# Patient Record
Sex: Female | Born: 1935 | Race: White | Hispanic: No | Smoking: Former smoker
Health system: Southern US, Community
[De-identification: ages and names within clinical notes are randomized; demographics above are authoritative.]

## PROBLEM LIST (undated history)

## (undated) DIAGNOSIS — I10 Essential (primary) hypertension: Secondary | ICD-10-CM

## (undated) DIAGNOSIS — F0390 Unspecified dementia without behavioral disturbance: Secondary | ICD-10-CM

## (undated) HISTORY — PX: ANKLE SURGERY: SHX546

---

## 2003-06-15 ENCOUNTER — Inpatient Hospital Stay (HOSPITAL_COMMUNITY): Admission: EM | Admit: 2003-06-15 | Discharge: 2003-06-19 | Payer: Self-pay | Admitting: Emergency Medicine

## 2011-06-17 DIAGNOSIS — M25569 Pain in unspecified knee: Secondary | ICD-10-CM | POA: Diagnosis not present

## 2011-06-17 DIAGNOSIS — I10 Essential (primary) hypertension: Secondary | ICD-10-CM | POA: Diagnosis not present

## 2011-06-17 DIAGNOSIS — E785 Hyperlipidemia, unspecified: Secondary | ICD-10-CM | POA: Diagnosis not present

## 2011-07-15 DIAGNOSIS — H409 Unspecified glaucoma: Secondary | ICD-10-CM | POA: Diagnosis not present

## 2011-07-15 DIAGNOSIS — H4011X0 Primary open-angle glaucoma, stage unspecified: Secondary | ICD-10-CM | POA: Diagnosis not present

## 2011-12-24 DIAGNOSIS — Z23 Encounter for immunization: Secondary | ICD-10-CM | POA: Diagnosis not present

## 2011-12-24 DIAGNOSIS — E785 Hyperlipidemia, unspecified: Secondary | ICD-10-CM | POA: Diagnosis not present

## 2011-12-24 DIAGNOSIS — I10 Essential (primary) hypertension: Secondary | ICD-10-CM | POA: Diagnosis not present

## 2011-12-24 DIAGNOSIS — Z0000 Encounter for general adult medical examination without abnormal findings: Secondary | ICD-10-CM | POA: Diagnosis not present

## 2011-12-24 DIAGNOSIS — M25569 Pain in unspecified knee: Secondary | ICD-10-CM | POA: Diagnosis not present

## 2012-01-27 DIAGNOSIS — H409 Unspecified glaucoma: Secondary | ICD-10-CM | POA: Diagnosis not present

## 2012-01-27 DIAGNOSIS — H4011X0 Primary open-angle glaucoma, stage unspecified: Secondary | ICD-10-CM | POA: Diagnosis not present

## 2012-04-24 DIAGNOSIS — E785 Hyperlipidemia, unspecified: Secondary | ICD-10-CM | POA: Diagnosis not present

## 2012-06-08 DIAGNOSIS — M899 Disorder of bone, unspecified: Secondary | ICD-10-CM | POA: Diagnosis not present

## 2012-06-08 DIAGNOSIS — I10 Essential (primary) hypertension: Secondary | ICD-10-CM | POA: Diagnosis not present

## 2012-06-08 DIAGNOSIS — E785 Hyperlipidemia, unspecified: Secondary | ICD-10-CM | POA: Diagnosis not present

## 2012-07-29 DIAGNOSIS — H40019 Open angle with borderline findings, low risk, unspecified eye: Secondary | ICD-10-CM | POA: Diagnosis not present

## 2013-01-25 DIAGNOSIS — H409 Unspecified glaucoma: Secondary | ICD-10-CM | POA: Diagnosis not present

## 2013-01-25 DIAGNOSIS — H4011X0 Primary open-angle glaucoma, stage unspecified: Secondary | ICD-10-CM | POA: Diagnosis not present

## 2013-03-02 DIAGNOSIS — H409 Unspecified glaucoma: Secondary | ICD-10-CM | POA: Diagnosis not present

## 2013-03-02 DIAGNOSIS — H4011X0 Primary open-angle glaucoma, stage unspecified: Secondary | ICD-10-CM | POA: Diagnosis not present

## 2013-03-17 DIAGNOSIS — Z23 Encounter for immunization: Secondary | ICD-10-CM | POA: Diagnosis not present

## 2013-03-17 DIAGNOSIS — I6350 Cerebral infarction due to unspecified occlusion or stenosis of unspecified cerebral artery: Secondary | ICD-10-CM | POA: Diagnosis not present

## 2013-03-17 DIAGNOSIS — I10 Essential (primary) hypertension: Secondary | ICD-10-CM | POA: Diagnosis not present

## 2013-03-17 DIAGNOSIS — M899 Disorder of bone, unspecified: Secondary | ICD-10-CM | POA: Diagnosis not present

## 2013-03-18 DIAGNOSIS — H409 Unspecified glaucoma: Secondary | ICD-10-CM | POA: Diagnosis not present

## 2013-03-18 DIAGNOSIS — H01029 Squamous blepharitis unspecified eye, unspecified eyelid: Secondary | ICD-10-CM | POA: Diagnosis not present

## 2013-03-18 DIAGNOSIS — H4011X0 Primary open-angle glaucoma, stage unspecified: Secondary | ICD-10-CM | POA: Diagnosis not present

## 2013-03-18 DIAGNOSIS — H04129 Dry eye syndrome of unspecified lacrimal gland: Secondary | ICD-10-CM | POA: Diagnosis not present

## 2013-04-22 DIAGNOSIS — H4011X0 Primary open-angle glaucoma, stage unspecified: Secondary | ICD-10-CM | POA: Diagnosis not present

## 2013-04-22 DIAGNOSIS — H409 Unspecified glaucoma: Secondary | ICD-10-CM | POA: Diagnosis not present

## 2013-05-19 ENCOUNTER — Other Ambulatory Visit: Payer: Self-pay | Admitting: Family Medicine

## 2013-05-19 ENCOUNTER — Ambulatory Visit
Admission: RE | Admit: 2013-05-19 | Discharge: 2013-05-19 | Disposition: A | Discharge status: Home / Self Care | Payer: Medicare Other | Source: Ambulatory Visit | Attending: Family Medicine | Admitting: Family Medicine

## 2013-05-19 DIAGNOSIS — S60229A Contusion of unspecified hand, initial encounter: Secondary | ICD-10-CM

## 2013-05-19 DIAGNOSIS — S62309A Unspecified fracture of unspecified metacarpal bone, initial encounter for closed fracture: Secondary | ICD-10-CM | POA: Diagnosis not present

## 2013-05-19 DIAGNOSIS — S62329A Displaced fracture of shaft of unspecified metacarpal bone, initial encounter for closed fracture: Secondary | ICD-10-CM | POA: Diagnosis not present

## 2013-05-21 DIAGNOSIS — S62309A Unspecified fracture of unspecified metacarpal bone, initial encounter for closed fracture: Secondary | ICD-10-CM | POA: Diagnosis not present

## 2013-05-28 DIAGNOSIS — S62309A Unspecified fracture of unspecified metacarpal bone, initial encounter for closed fracture: Secondary | ICD-10-CM | POA: Diagnosis not present

## 2013-05-28 DIAGNOSIS — M79609 Pain in unspecified limb: Secondary | ICD-10-CM | POA: Diagnosis not present

## 2013-06-04 DIAGNOSIS — S62309A Unspecified fracture of unspecified metacarpal bone, initial encounter for closed fracture: Secondary | ICD-10-CM | POA: Diagnosis not present

## 2013-06-14 DIAGNOSIS — S62309A Unspecified fracture of unspecified metacarpal bone, initial encounter for closed fracture: Secondary | ICD-10-CM | POA: Diagnosis not present

## 2013-07-06 DIAGNOSIS — S62309A Unspecified fracture of unspecified metacarpal bone, initial encounter for closed fracture: Secondary | ICD-10-CM | POA: Diagnosis not present

## 2013-07-21 DIAGNOSIS — S62309A Unspecified fracture of unspecified metacarpal bone, initial encounter for closed fracture: Secondary | ICD-10-CM | POA: Diagnosis not present

## 2013-07-21 DIAGNOSIS — M79609 Pain in unspecified limb: Secondary | ICD-10-CM | POA: Diagnosis not present

## 2013-07-27 DIAGNOSIS — H04129 Dry eye syndrome of unspecified lacrimal gland: Secondary | ICD-10-CM | POA: Diagnosis not present

## 2013-07-27 DIAGNOSIS — H4011X0 Primary open-angle glaucoma, stage unspecified: Secondary | ICD-10-CM | POA: Diagnosis not present

## 2013-07-27 DIAGNOSIS — H409 Unspecified glaucoma: Secondary | ICD-10-CM | POA: Diagnosis not present

## 2013-07-27 DIAGNOSIS — H01029 Squamous blepharitis unspecified eye, unspecified eyelid: Secondary | ICD-10-CM | POA: Diagnosis not present

## 2013-09-15 DIAGNOSIS — E785 Hyperlipidemia, unspecified: Secondary | ICD-10-CM | POA: Diagnosis not present

## 2013-09-15 DIAGNOSIS — M949 Disorder of cartilage, unspecified: Secondary | ICD-10-CM | POA: Diagnosis not present

## 2013-09-15 DIAGNOSIS — M25569 Pain in unspecified knee: Secondary | ICD-10-CM | POA: Diagnosis not present

## 2013-09-15 DIAGNOSIS — Z0000 Encounter for general adult medical examination without abnormal findings: Secondary | ICD-10-CM | POA: Diagnosis not present

## 2013-09-15 DIAGNOSIS — I10 Essential (primary) hypertension: Secondary | ICD-10-CM | POA: Diagnosis not present

## 2013-09-15 DIAGNOSIS — M899 Disorder of bone, unspecified: Secondary | ICD-10-CM | POA: Diagnosis not present

## 2013-09-15 DIAGNOSIS — R636 Underweight: Secondary | ICD-10-CM | POA: Diagnosis not present

## 2013-10-04 DIAGNOSIS — M899 Disorder of bone, unspecified: Secondary | ICD-10-CM | POA: Diagnosis not present

## 2013-10-04 DIAGNOSIS — M949 Disorder of cartilage, unspecified: Secondary | ICD-10-CM | POA: Diagnosis not present

## 2013-11-02 DIAGNOSIS — H409 Unspecified glaucoma: Secondary | ICD-10-CM | POA: Diagnosis not present

## 2013-11-02 DIAGNOSIS — H4011X0 Primary open-angle glaucoma, stage unspecified: Secondary | ICD-10-CM | POA: Diagnosis not present

## 2014-03-17 DIAGNOSIS — J069 Acute upper respiratory infection, unspecified: Secondary | ICD-10-CM | POA: Diagnosis not present

## 2014-03-17 DIAGNOSIS — M67441 Ganglion, right hand: Secondary | ICD-10-CM | POA: Diagnosis not present

## 2014-03-17 DIAGNOSIS — I10 Essential (primary) hypertension: Secondary | ICD-10-CM | POA: Diagnosis not present

## 2014-03-17 DIAGNOSIS — E780 Pure hypercholesterolemia: Secondary | ICD-10-CM | POA: Diagnosis not present

## 2014-03-17 DIAGNOSIS — Z23 Encounter for immunization: Secondary | ICD-10-CM | POA: Diagnosis not present

## 2014-03-17 DIAGNOSIS — E46 Unspecified protein-calorie malnutrition: Secondary | ICD-10-CM | POA: Diagnosis not present

## 2014-03-17 DIAGNOSIS — R233 Spontaneous ecchymoses: Secondary | ICD-10-CM | POA: Diagnosis not present

## 2014-04-11 DIAGNOSIS — E785 Hyperlipidemia, unspecified: Secondary | ICD-10-CM | POA: Diagnosis not present

## 2014-05-03 DIAGNOSIS — H4011X0 Primary open-angle glaucoma, stage unspecified: Secondary | ICD-10-CM | POA: Diagnosis not present

## 2014-09-19 DIAGNOSIS — E46 Unspecified protein-calorie malnutrition: Secondary | ICD-10-CM | POA: Diagnosis not present

## 2014-09-19 DIAGNOSIS — Z0000 Encounter for general adult medical examination without abnormal findings: Secondary | ICD-10-CM | POA: Diagnosis not present

## 2014-09-19 DIAGNOSIS — Z1389 Encounter for screening for other disorder: Secondary | ICD-10-CM | POA: Diagnosis not present

## 2014-09-19 DIAGNOSIS — D692 Other nonthrombocytopenic purpura: Secondary | ICD-10-CM | POA: Diagnosis not present

## 2014-09-19 DIAGNOSIS — E780 Pure hypercholesterolemia: Secondary | ICD-10-CM | POA: Diagnosis not present

## 2014-09-19 DIAGNOSIS — M8580 Other specified disorders of bone density and structure, unspecified site: Secondary | ICD-10-CM | POA: Diagnosis not present

## 2014-09-19 DIAGNOSIS — I10 Essential (primary) hypertension: Secondary | ICD-10-CM | POA: Diagnosis not present

## 2014-11-01 DIAGNOSIS — H04121 Dry eye syndrome of right lacrimal gland: Secondary | ICD-10-CM | POA: Diagnosis not present

## 2014-11-01 DIAGNOSIS — H4011X1 Primary open-angle glaucoma, mild stage: Secondary | ICD-10-CM | POA: Diagnosis not present

## 2015-04-06 DIAGNOSIS — Z23 Encounter for immunization: Secondary | ICD-10-CM | POA: Diagnosis not present

## 2015-04-06 DIAGNOSIS — I10 Essential (primary) hypertension: Secondary | ICD-10-CM | POA: Diagnosis not present

## 2015-04-06 DIAGNOSIS — J069 Acute upper respiratory infection, unspecified: Secondary | ICD-10-CM | POA: Diagnosis not present

## 2015-04-06 DIAGNOSIS — E7800 Pure hypercholesterolemia, unspecified: Secondary | ICD-10-CM | POA: Diagnosis not present

## 2015-05-04 DIAGNOSIS — H04121 Dry eye syndrome of right lacrimal gland: Secondary | ICD-10-CM | POA: Diagnosis not present

## 2015-05-04 DIAGNOSIS — H401131 Primary open-angle glaucoma, bilateral, mild stage: Secondary | ICD-10-CM | POA: Diagnosis not present

## 2015-05-04 DIAGNOSIS — H53013 Deprivation amblyopia, bilateral: Secondary | ICD-10-CM | POA: Diagnosis not present

## 2015-05-04 DIAGNOSIS — H4720 Unspecified optic atrophy: Secondary | ICD-10-CM | POA: Diagnosis not present

## 2015-11-08 DIAGNOSIS — H01001 Unspecified blepharitis right upper eyelid: Secondary | ICD-10-CM | POA: Diagnosis not present

## 2015-11-08 DIAGNOSIS — H01004 Unspecified blepharitis left upper eyelid: Secondary | ICD-10-CM | POA: Diagnosis not present

## 2015-11-08 DIAGNOSIS — H4720 Unspecified optic atrophy: Secondary | ICD-10-CM | POA: Diagnosis not present

## 2015-11-08 DIAGNOSIS — H401131 Primary open-angle glaucoma, bilateral, mild stage: Secondary | ICD-10-CM | POA: Diagnosis not present

## 2015-11-08 DIAGNOSIS — H53031 Strabismic amblyopia, right eye: Secondary | ICD-10-CM | POA: Diagnosis not present

## 2015-11-08 DIAGNOSIS — H01002 Unspecified blepharitis right lower eyelid: Secondary | ICD-10-CM | POA: Diagnosis not present

## 2015-11-08 DIAGNOSIS — H01005 Unspecified blepharitis left lower eyelid: Secondary | ICD-10-CM | POA: Diagnosis not present

## 2015-11-08 DIAGNOSIS — H04123 Dry eye syndrome of bilateral lacrimal glands: Secondary | ICD-10-CM | POA: Diagnosis not present

## 2015-11-15 DIAGNOSIS — M545 Low back pain: Secondary | ICD-10-CM | POA: Diagnosis not present

## 2015-11-15 DIAGNOSIS — D692 Other nonthrombocytopenic purpura: Secondary | ICD-10-CM | POA: Diagnosis not present

## 2015-11-15 DIAGNOSIS — Z0000 Encounter for general adult medical examination without abnormal findings: Secondary | ICD-10-CM | POA: Diagnosis not present

## 2015-11-15 DIAGNOSIS — E46 Unspecified protein-calorie malnutrition: Secondary | ICD-10-CM | POA: Diagnosis not present

## 2015-11-15 DIAGNOSIS — I10 Essential (primary) hypertension: Secondary | ICD-10-CM | POA: Diagnosis not present

## 2015-11-15 DIAGNOSIS — E7800 Pure hypercholesterolemia, unspecified: Secondary | ICD-10-CM | POA: Diagnosis not present

## 2015-11-15 DIAGNOSIS — R202 Paresthesia of skin: Secondary | ICD-10-CM | POA: Diagnosis not present

## 2015-11-15 DIAGNOSIS — Z1389 Encounter for screening for other disorder: Secondary | ICD-10-CM | POA: Diagnosis not present

## 2015-11-15 DIAGNOSIS — M8580 Other specified disorders of bone density and structure, unspecified site: Secondary | ICD-10-CM | POA: Diagnosis not present

## 2015-12-12 DIAGNOSIS — H01004 Unspecified blepharitis left upper eyelid: Secondary | ICD-10-CM | POA: Diagnosis not present

## 2015-12-12 DIAGNOSIS — H01002 Unspecified blepharitis right lower eyelid: Secondary | ICD-10-CM | POA: Diagnosis not present

## 2015-12-12 DIAGNOSIS — H401131 Primary open-angle glaucoma, bilateral, mild stage: Secondary | ICD-10-CM | POA: Diagnosis not present

## 2015-12-12 DIAGNOSIS — H4720 Unspecified optic atrophy: Secondary | ICD-10-CM | POA: Diagnosis not present

## 2015-12-12 DIAGNOSIS — H53031 Strabismic amblyopia, right eye: Secondary | ICD-10-CM | POA: Diagnosis not present

## 2015-12-12 DIAGNOSIS — H01001 Unspecified blepharitis right upper eyelid: Secondary | ICD-10-CM | POA: Diagnosis not present

## 2015-12-12 DIAGNOSIS — H01005 Unspecified blepharitis left lower eyelid: Secondary | ICD-10-CM | POA: Diagnosis not present

## 2015-12-12 DIAGNOSIS — H04123 Dry eye syndrome of bilateral lacrimal glands: Secondary | ICD-10-CM | POA: Diagnosis not present

## 2016-03-05 DIAGNOSIS — M8588 Other specified disorders of bone density and structure, other site: Secondary | ICD-10-CM | POA: Diagnosis not present

## 2016-03-05 DIAGNOSIS — M810 Age-related osteoporosis without current pathological fracture: Secondary | ICD-10-CM | POA: Diagnosis not present

## 2016-04-09 DIAGNOSIS — H01001 Unspecified blepharitis right upper eyelid: Secondary | ICD-10-CM | POA: Diagnosis not present

## 2016-04-09 DIAGNOSIS — H53031 Strabismic amblyopia, right eye: Secondary | ICD-10-CM | POA: Diagnosis not present

## 2016-04-09 DIAGNOSIS — H01002 Unspecified blepharitis right lower eyelid: Secondary | ICD-10-CM | POA: Diagnosis not present

## 2016-04-09 DIAGNOSIS — H04123 Dry eye syndrome of bilateral lacrimal glands: Secondary | ICD-10-CM | POA: Diagnosis not present

## 2016-04-09 DIAGNOSIS — H4720 Unspecified optic atrophy: Secondary | ICD-10-CM | POA: Diagnosis not present

## 2016-04-09 DIAGNOSIS — H01004 Unspecified blepharitis left upper eyelid: Secondary | ICD-10-CM | POA: Diagnosis not present

## 2016-04-09 DIAGNOSIS — H01005 Unspecified blepharitis left lower eyelid: Secondary | ICD-10-CM | POA: Diagnosis not present

## 2016-04-09 DIAGNOSIS — H401131 Primary open-angle glaucoma, bilateral, mild stage: Secondary | ICD-10-CM | POA: Diagnosis not present

## 2016-05-14 DIAGNOSIS — H16223 Keratoconjunctivitis sicca, not specified as Sjogren's, bilateral: Secondary | ICD-10-CM | POA: Diagnosis not present

## 2016-05-14 DIAGNOSIS — H53031 Strabismic amblyopia, right eye: Secondary | ICD-10-CM | POA: Diagnosis not present

## 2016-05-14 DIAGNOSIS — H04123 Dry eye syndrome of bilateral lacrimal glands: Secondary | ICD-10-CM | POA: Diagnosis not present

## 2016-05-14 DIAGNOSIS — H401131 Primary open-angle glaucoma, bilateral, mild stage: Secondary | ICD-10-CM | POA: Diagnosis not present

## 2016-08-13 DIAGNOSIS — H01001 Unspecified blepharitis right upper eyelid: Secondary | ICD-10-CM | POA: Diagnosis not present

## 2016-08-13 DIAGNOSIS — H16223 Keratoconjunctivitis sicca, not specified as Sjogren's, bilateral: Secondary | ICD-10-CM | POA: Diagnosis not present

## 2016-08-13 DIAGNOSIS — H04123 Dry eye syndrome of bilateral lacrimal glands: Secondary | ICD-10-CM | POA: Diagnosis not present

## 2016-08-13 DIAGNOSIS — H401134 Primary open-angle glaucoma, bilateral, indeterminate stage: Secondary | ICD-10-CM | POA: Diagnosis not present

## 2016-08-13 DIAGNOSIS — H01002 Unspecified blepharitis right lower eyelid: Secondary | ICD-10-CM | POA: Diagnosis not present

## 2017-02-04 DIAGNOSIS — Z961 Presence of intraocular lens: Secondary | ICD-10-CM | POA: Diagnosis not present

## 2017-02-04 DIAGNOSIS — H401131 Primary open-angle glaucoma, bilateral, mild stage: Secondary | ICD-10-CM | POA: Diagnosis not present

## 2017-03-17 DIAGNOSIS — H401131 Primary open-angle glaucoma, bilateral, mild stage: Secondary | ICD-10-CM | POA: Diagnosis not present

## 2017-04-15 DIAGNOSIS — H52223 Regular astigmatism, bilateral: Secondary | ICD-10-CM | POA: Diagnosis not present

## 2017-04-15 DIAGNOSIS — H524 Presbyopia: Secondary | ICD-10-CM | POA: Diagnosis not present

## 2017-04-15 DIAGNOSIS — H4720 Unspecified optic atrophy: Secondary | ICD-10-CM | POA: Diagnosis not present

## 2017-04-15 DIAGNOSIS — H04123 Dry eye syndrome of bilateral lacrimal glands: Secondary | ICD-10-CM | POA: Diagnosis not present

## 2017-04-15 DIAGNOSIS — H401134 Primary open-angle glaucoma, bilateral, indeterminate stage: Secondary | ICD-10-CM | POA: Diagnosis not present

## 2017-04-15 DIAGNOSIS — H5213 Myopia, bilateral: Secondary | ICD-10-CM | POA: Diagnosis not present

## 2017-04-15 DIAGNOSIS — H16223 Keratoconjunctivitis sicca, not specified as Sjogren's, bilateral: Secondary | ICD-10-CM | POA: Diagnosis not present

## 2017-05-20 DIAGNOSIS — H4720 Unspecified optic atrophy: Secondary | ICD-10-CM | POA: Diagnosis not present

## 2017-05-20 DIAGNOSIS — H401134 Primary open-angle glaucoma, bilateral, indeterminate stage: Secondary | ICD-10-CM | POA: Diagnosis not present

## 2017-05-20 DIAGNOSIS — H04123 Dry eye syndrome of bilateral lacrimal glands: Secondary | ICD-10-CM | POA: Diagnosis not present

## 2017-05-20 DIAGNOSIS — H53001 Unspecified amblyopia, right eye: Secondary | ICD-10-CM | POA: Diagnosis not present

## 2017-05-20 DIAGNOSIS — D3132 Benign neoplasm of left choroid: Secondary | ICD-10-CM | POA: Diagnosis not present

## 2017-05-20 DIAGNOSIS — H16223 Keratoconjunctivitis sicca, not specified as Sjogren's, bilateral: Secondary | ICD-10-CM | POA: Diagnosis not present

## 2017-06-17 DIAGNOSIS — H401131 Primary open-angle glaucoma, bilateral, mild stage: Secondary | ICD-10-CM | POA: Diagnosis not present

## 2017-10-07 DIAGNOSIS — F17210 Nicotine dependence, cigarettes, uncomplicated: Secondary | ICD-10-CM | POA: Diagnosis not present

## 2017-10-07 DIAGNOSIS — Z1389 Encounter for screening for other disorder: Secondary | ICD-10-CM | POA: Diagnosis not present

## 2017-10-07 DIAGNOSIS — M810 Age-related osteoporosis without current pathological fracture: Secondary | ICD-10-CM | POA: Diagnosis not present

## 2017-10-07 DIAGNOSIS — I10 Essential (primary) hypertension: Secondary | ICD-10-CM | POA: Diagnosis not present

## 2017-10-07 DIAGNOSIS — E7800 Pure hypercholesterolemia, unspecified: Secondary | ICD-10-CM | POA: Diagnosis not present

## 2017-10-07 DIAGNOSIS — E43 Unspecified severe protein-calorie malnutrition: Secondary | ICD-10-CM | POA: Diagnosis not present

## 2018-02-17 DIAGNOSIS — H53001 Unspecified amblyopia, right eye: Secondary | ICD-10-CM | POA: Diagnosis not present

## 2018-02-17 DIAGNOSIS — H16223 Keratoconjunctivitis sicca, not specified as Sjogren's, bilateral: Secondary | ICD-10-CM | POA: Diagnosis not present

## 2018-02-17 DIAGNOSIS — H04123 Dry eye syndrome of bilateral lacrimal glands: Secondary | ICD-10-CM | POA: Diagnosis not present

## 2018-02-17 DIAGNOSIS — H401134 Primary open-angle glaucoma, bilateral, indeterminate stage: Secondary | ICD-10-CM | POA: Diagnosis not present

## 2018-02-20 DIAGNOSIS — H04123 Dry eye syndrome of bilateral lacrimal glands: Secondary | ICD-10-CM | POA: Diagnosis not present

## 2018-02-20 DIAGNOSIS — H401134 Primary open-angle glaucoma, bilateral, indeterminate stage: Secondary | ICD-10-CM | POA: Diagnosis not present

## 2018-02-20 DIAGNOSIS — H16223 Keratoconjunctivitis sicca, not specified as Sjogren's, bilateral: Secondary | ICD-10-CM | POA: Diagnosis not present

## 2018-02-20 DIAGNOSIS — H4720 Unspecified optic atrophy: Secondary | ICD-10-CM | POA: Diagnosis not present

## 2018-03-17 DIAGNOSIS — H16223 Keratoconjunctivitis sicca, not specified as Sjogren's, bilateral: Secondary | ICD-10-CM | POA: Diagnosis not present

## 2018-03-17 DIAGNOSIS — H401134 Primary open-angle glaucoma, bilateral, indeterminate stage: Secondary | ICD-10-CM | POA: Diagnosis not present

## 2018-03-17 DIAGNOSIS — H04123 Dry eye syndrome of bilateral lacrimal glands: Secondary | ICD-10-CM | POA: Diagnosis not present

## 2018-05-15 DIAGNOSIS — Z0000 Encounter for general adult medical examination without abnormal findings: Secondary | ICD-10-CM | POA: Diagnosis not present

## 2018-05-15 DIAGNOSIS — E43 Unspecified severe protein-calorie malnutrition: Secondary | ICD-10-CM | POA: Diagnosis not present

## 2018-05-15 DIAGNOSIS — D692 Other nonthrombocytopenic purpura: Secondary | ICD-10-CM | POA: Diagnosis not present

## 2018-05-15 DIAGNOSIS — I10 Essential (primary) hypertension: Secondary | ICD-10-CM | POA: Diagnosis not present

## 2018-05-15 DIAGNOSIS — E2839 Other primary ovarian failure: Secondary | ICD-10-CM | POA: Diagnosis not present

## 2018-05-15 DIAGNOSIS — M810 Age-related osteoporosis without current pathological fracture: Secondary | ICD-10-CM | POA: Diagnosis not present

## 2018-05-15 DIAGNOSIS — E7800 Pure hypercholesterolemia, unspecified: Secondary | ICD-10-CM | POA: Diagnosis not present

## 2018-05-15 DIAGNOSIS — R202 Paresthesia of skin: Secondary | ICD-10-CM | POA: Diagnosis not present

## 2018-05-15 DIAGNOSIS — M545 Low back pain: Secondary | ICD-10-CM | POA: Diagnosis not present

## 2018-05-15 DIAGNOSIS — F17210 Nicotine dependence, cigarettes, uncomplicated: Secondary | ICD-10-CM | POA: Diagnosis not present

## 2018-05-15 DIAGNOSIS — E46 Unspecified protein-calorie malnutrition: Secondary | ICD-10-CM | POA: Diagnosis not present

## 2018-09-08 DIAGNOSIS — H401134 Primary open-angle glaucoma, bilateral, indeterminate stage: Secondary | ICD-10-CM | POA: Diagnosis not present

## 2018-09-08 DIAGNOSIS — H04123 Dry eye syndrome of bilateral lacrimal glands: Secondary | ICD-10-CM | POA: Diagnosis not present

## 2018-09-08 DIAGNOSIS — H53001 Unspecified amblyopia, right eye: Secondary | ICD-10-CM | POA: Diagnosis not present

## 2018-09-08 DIAGNOSIS — H16223 Keratoconjunctivitis sicca, not specified as Sjogren's, bilateral: Secondary | ICD-10-CM | POA: Diagnosis not present

## 2018-11-10 DIAGNOSIS — H20052 Hypopyon, left eye: Secondary | ICD-10-CM | POA: Diagnosis not present

## 2018-11-10 DIAGNOSIS — H5712 Ocular pain, left eye: Secondary | ICD-10-CM | POA: Diagnosis not present

## 2018-11-11 DIAGNOSIS — H16002 Unspecified corneal ulcer, left eye: Secondary | ICD-10-CM | POA: Diagnosis not present

## 2018-11-11 DIAGNOSIS — L03213 Periorbital cellulitis: Secondary | ICD-10-CM | POA: Diagnosis not present

## 2018-11-11 DIAGNOSIS — H18892 Other specified disorders of cornea, left eye: Secondary | ICD-10-CM | POA: Diagnosis not present

## 2018-11-12 DIAGNOSIS — F17200 Nicotine dependence, unspecified, uncomplicated: Secondary | ICD-10-CM | POA: Diagnosis present

## 2018-11-12 DIAGNOSIS — D3132 Benign neoplasm of left choroid: Secondary | ICD-10-CM | POA: Diagnosis present

## 2018-11-12 DIAGNOSIS — L03213 Periorbital cellulitis: Secondary | ICD-10-CM | POA: Diagnosis present

## 2018-11-12 DIAGNOSIS — R410 Disorientation, unspecified: Secondary | ICD-10-CM | POA: Diagnosis not present

## 2018-11-12 DIAGNOSIS — Z79899 Other long term (current) drug therapy: Secondary | ICD-10-CM | POA: Diagnosis not present

## 2018-11-12 DIAGNOSIS — Z8673 Personal history of transient ischemic attack (TIA), and cerebral infarction without residual deficits: Secondary | ICD-10-CM | POA: Diagnosis not present

## 2018-11-12 DIAGNOSIS — H5789 Other specified disorders of eye and adnexa: Secondary | ICD-10-CM | POA: Diagnosis present

## 2018-11-12 DIAGNOSIS — R6889 Other general symptoms and signs: Secondary | ICD-10-CM | POA: Diagnosis present

## 2018-11-12 DIAGNOSIS — H5010 Unspecified exotropia: Secondary | ICD-10-CM | POA: Diagnosis present

## 2018-11-12 DIAGNOSIS — H53001 Unspecified amblyopia, right eye: Secondary | ICD-10-CM | POA: Diagnosis present

## 2018-11-12 DIAGNOSIS — H401130 Primary open-angle glaucoma, bilateral, stage unspecified: Secondary | ICD-10-CM | POA: Diagnosis present

## 2018-11-12 DIAGNOSIS — I2510 Atherosclerotic heart disease of native coronary artery without angina pectoris: Secondary | ICD-10-CM | POA: Diagnosis not present

## 2018-11-12 DIAGNOSIS — I10 Essential (primary) hypertension: Secondary | ICD-10-CM | POA: Diagnosis not present

## 2018-11-12 DIAGNOSIS — H16002 Unspecified corneal ulcer, left eye: Secondary | ICD-10-CM | POA: Diagnosis present

## 2018-11-12 DIAGNOSIS — Z7982 Long term (current) use of aspirin: Secondary | ICD-10-CM | POA: Diagnosis not present

## 2018-11-12 DIAGNOSIS — M1990 Unspecified osteoarthritis, unspecified site: Secondary | ICD-10-CM | POA: Diagnosis present

## 2018-11-12 DIAGNOSIS — Z961 Presence of intraocular lens: Secondary | ICD-10-CM | POA: Diagnosis present

## 2018-11-16 DIAGNOSIS — L03213 Periorbital cellulitis: Secondary | ICD-10-CM | POA: Diagnosis not present

## 2018-11-16 DIAGNOSIS — H04129 Dry eye syndrome of unspecified lacrimal gland: Secondary | ICD-10-CM | POA: Diagnosis not present

## 2018-11-16 DIAGNOSIS — Z79899 Other long term (current) drug therapy: Secondary | ICD-10-CM | POA: Diagnosis not present

## 2018-11-16 DIAGNOSIS — H16002 Unspecified corneal ulcer, left eye: Secondary | ICD-10-CM | POA: Diagnosis not present

## 2018-11-16 DIAGNOSIS — H5010 Unspecified exotropia: Secondary | ICD-10-CM | POA: Diagnosis not present

## 2018-11-16 DIAGNOSIS — H53001 Unspecified amblyopia, right eye: Secondary | ICD-10-CM | POA: Diagnosis not present

## 2018-11-16 DIAGNOSIS — H401130 Primary open-angle glaucoma, bilateral, stage unspecified: Secondary | ICD-10-CM | POA: Diagnosis not present

## 2018-11-16 DIAGNOSIS — D3132 Benign neoplasm of left choroid: Secondary | ICD-10-CM | POA: Diagnosis not present

## 2018-11-20 DIAGNOSIS — Z79899 Other long term (current) drug therapy: Secondary | ICD-10-CM | POA: Diagnosis not present

## 2018-11-20 DIAGNOSIS — D3132 Benign neoplasm of left choroid: Secondary | ICD-10-CM | POA: Diagnosis not present

## 2018-11-20 DIAGNOSIS — H53001 Unspecified amblyopia, right eye: Secondary | ICD-10-CM | POA: Diagnosis not present

## 2018-11-20 DIAGNOSIS — H04129 Dry eye syndrome of unspecified lacrimal gland: Secondary | ICD-10-CM | POA: Diagnosis not present

## 2018-11-20 DIAGNOSIS — H401130 Primary open-angle glaucoma, bilateral, stage unspecified: Secondary | ICD-10-CM | POA: Diagnosis not present

## 2018-11-20 DIAGNOSIS — H16002 Unspecified corneal ulcer, left eye: Secondary | ICD-10-CM | POA: Diagnosis not present

## 2018-11-20 DIAGNOSIS — H5010 Unspecified exotropia: Secondary | ICD-10-CM | POA: Diagnosis not present

## 2018-11-23 DIAGNOSIS — H53001 Unspecified amblyopia, right eye: Secondary | ICD-10-CM | POA: Diagnosis not present

## 2018-11-23 DIAGNOSIS — H04129 Dry eye syndrome of unspecified lacrimal gland: Secondary | ICD-10-CM | POA: Diagnosis not present

## 2018-11-23 DIAGNOSIS — H16002 Unspecified corneal ulcer, left eye: Secondary | ICD-10-CM | POA: Diagnosis not present

## 2018-11-23 DIAGNOSIS — Z961 Presence of intraocular lens: Secondary | ICD-10-CM | POA: Diagnosis not present

## 2018-11-23 DIAGNOSIS — H50331 Intermittent monocular exotropia, right eye: Secondary | ICD-10-CM | POA: Diagnosis not present

## 2018-11-23 DIAGNOSIS — H401130 Primary open-angle glaucoma, bilateral, stage unspecified: Secondary | ICD-10-CM | POA: Diagnosis not present

## 2018-11-23 DIAGNOSIS — Z79899 Other long term (current) drug therapy: Secondary | ICD-10-CM | POA: Diagnosis not present

## 2018-11-23 DIAGNOSIS — D3132 Benign neoplasm of left choroid: Secondary | ICD-10-CM | POA: Diagnosis not present

## 2018-11-25 DIAGNOSIS — H04129 Dry eye syndrome of unspecified lacrimal gland: Secondary | ICD-10-CM | POA: Diagnosis not present

## 2018-11-25 DIAGNOSIS — Z961 Presence of intraocular lens: Secondary | ICD-10-CM | POA: Diagnosis not present

## 2018-11-25 DIAGNOSIS — H16002 Unspecified corneal ulcer, left eye: Secondary | ICD-10-CM | POA: Diagnosis not present

## 2018-11-25 DIAGNOSIS — Z79899 Other long term (current) drug therapy: Secondary | ICD-10-CM | POA: Diagnosis not present

## 2018-11-25 DIAGNOSIS — H5462 Unqualified visual loss, left eye, normal vision right eye: Secondary | ICD-10-CM | POA: Diagnosis not present

## 2018-11-25 DIAGNOSIS — F331 Major depressive disorder, recurrent, moderate: Secondary | ICD-10-CM | POA: Diagnosis not present

## 2018-11-25 DIAGNOSIS — H53001 Unspecified amblyopia, right eye: Secondary | ICD-10-CM | POA: Diagnosis not present

## 2018-11-25 DIAGNOSIS — R3915 Urgency of urination: Secondary | ICD-10-CM | POA: Diagnosis not present

## 2018-11-25 DIAGNOSIS — D3132 Benign neoplasm of left choroid: Secondary | ICD-10-CM | POA: Diagnosis not present

## 2018-11-25 DIAGNOSIS — H401134 Primary open-angle glaucoma, bilateral, indeterminate stage: Secondary | ICD-10-CM | POA: Diagnosis not present

## 2018-11-25 DIAGNOSIS — H50331 Intermittent monocular exotropia, right eye: Secondary | ICD-10-CM | POA: Diagnosis not present

## 2018-11-27 DIAGNOSIS — H04129 Dry eye syndrome of unspecified lacrimal gland: Secondary | ICD-10-CM | POA: Diagnosis not present

## 2018-11-27 DIAGNOSIS — H16002 Unspecified corneal ulcer, left eye: Secondary | ICD-10-CM | POA: Diagnosis not present

## 2018-11-27 DIAGNOSIS — H401130 Primary open-angle glaucoma, bilateral, stage unspecified: Secondary | ICD-10-CM | POA: Diagnosis not present

## 2018-11-27 DIAGNOSIS — D3132 Benign neoplasm of left choroid: Secondary | ICD-10-CM | POA: Diagnosis not present

## 2018-11-27 DIAGNOSIS — Z79899 Other long term (current) drug therapy: Secondary | ICD-10-CM | POA: Diagnosis not present

## 2018-11-27 DIAGNOSIS — Z961 Presence of intraocular lens: Secondary | ICD-10-CM | POA: Diagnosis not present

## 2018-11-27 DIAGNOSIS — H53001 Unspecified amblyopia, right eye: Secondary | ICD-10-CM | POA: Diagnosis not present

## 2018-11-27 DIAGNOSIS — H5010 Unspecified exotropia: Secondary | ICD-10-CM | POA: Diagnosis not present

## 2018-11-30 DIAGNOSIS — H401130 Primary open-angle glaucoma, bilateral, stage unspecified: Secondary | ICD-10-CM | POA: Diagnosis not present

## 2018-11-30 DIAGNOSIS — Z01812 Encounter for preprocedural laboratory examination: Secondary | ICD-10-CM | POA: Diagnosis not present

## 2018-11-30 DIAGNOSIS — D3132 Benign neoplasm of left choroid: Secondary | ICD-10-CM | POA: Diagnosis not present

## 2018-11-30 DIAGNOSIS — Z961 Presence of intraocular lens: Secondary | ICD-10-CM | POA: Diagnosis not present

## 2018-11-30 DIAGNOSIS — H16002 Unspecified corneal ulcer, left eye: Secondary | ICD-10-CM | POA: Diagnosis not present

## 2018-11-30 DIAGNOSIS — H31402 Unspecified choroidal detachment, left eye: Secondary | ICD-10-CM | POA: Diagnosis not present

## 2018-11-30 DIAGNOSIS — Z01818 Encounter for other preprocedural examination: Secondary | ICD-10-CM | POA: Diagnosis not present

## 2018-11-30 DIAGNOSIS — H53002 Unspecified amblyopia, left eye: Secondary | ICD-10-CM | POA: Diagnosis not present

## 2018-11-30 DIAGNOSIS — Z20828 Contact with and (suspected) exposure to other viral communicable diseases: Secondary | ICD-10-CM | POA: Diagnosis not present

## 2018-11-30 DIAGNOSIS — Z79899 Other long term (current) drug therapy: Secondary | ICD-10-CM | POA: Diagnosis not present

## 2018-11-30 DIAGNOSIS — H04129 Dry eye syndrome of unspecified lacrimal gland: Secondary | ICD-10-CM | POA: Diagnosis not present

## 2018-11-30 DIAGNOSIS — H5010 Unspecified exotropia: Secondary | ICD-10-CM | POA: Diagnosis not present

## 2018-12-02 DIAGNOSIS — H16002 Unspecified corneal ulcer, left eye: Secondary | ICD-10-CM | POA: Diagnosis not present

## 2018-12-02 DIAGNOSIS — H15042 Scleritis with corneal involvement, left eye: Secondary | ICD-10-CM | POA: Diagnosis not present

## 2018-12-03 DIAGNOSIS — D3132 Benign neoplasm of left choroid: Secondary | ICD-10-CM | POA: Diagnosis not present

## 2018-12-03 DIAGNOSIS — H53001 Unspecified amblyopia, right eye: Secondary | ICD-10-CM | POA: Diagnosis not present

## 2018-12-03 DIAGNOSIS — Z961 Presence of intraocular lens: Secondary | ICD-10-CM | POA: Diagnosis not present

## 2018-12-03 DIAGNOSIS — H16002 Unspecified corneal ulcer, left eye: Secondary | ICD-10-CM | POA: Diagnosis not present

## 2018-12-04 DIAGNOSIS — D3132 Benign neoplasm of left choroid: Secondary | ICD-10-CM | POA: Diagnosis not present

## 2018-12-04 DIAGNOSIS — Z48810 Encounter for surgical aftercare following surgery on the sense organs: Secondary | ICD-10-CM | POA: Diagnosis not present

## 2018-12-04 DIAGNOSIS — H15092 Other scleritis, left eye: Secondary | ICD-10-CM | POA: Diagnosis not present

## 2018-12-04 DIAGNOSIS — H53001 Unspecified amblyopia, right eye: Secondary | ICD-10-CM | POA: Diagnosis not present

## 2018-12-04 DIAGNOSIS — H16002 Unspecified corneal ulcer, left eye: Secondary | ICD-10-CM | POA: Diagnosis not present

## 2018-12-04 DIAGNOSIS — Z79899 Other long term (current) drug therapy: Secondary | ICD-10-CM | POA: Diagnosis not present

## 2018-12-11 DIAGNOSIS — H15092 Other scleritis, left eye: Secondary | ICD-10-CM | POA: Diagnosis not present

## 2018-12-24 DIAGNOSIS — R7301 Impaired fasting glucose: Secondary | ICD-10-CM | POA: Diagnosis not present

## 2018-12-24 DIAGNOSIS — F331 Major depressive disorder, recurrent, moderate: Secondary | ICD-10-CM | POA: Diagnosis not present

## 2018-12-24 DIAGNOSIS — I10 Essential (primary) hypertension: Secondary | ICD-10-CM | POA: Diagnosis not present

## 2018-12-24 DIAGNOSIS — R32 Unspecified urinary incontinence: Secondary | ICD-10-CM | POA: Diagnosis not present

## 2018-12-24 DIAGNOSIS — R410 Disorientation, unspecified: Secondary | ICD-10-CM | POA: Diagnosis not present

## 2018-12-31 DIAGNOSIS — W19XXXA Unspecified fall, initial encounter: Secondary | ICD-10-CM | POA: Diagnosis not present

## 2018-12-31 DIAGNOSIS — I491 Atrial premature depolarization: Secondary | ICD-10-CM | POA: Diagnosis not present

## 2018-12-31 DIAGNOSIS — I959 Hypotension, unspecified: Secondary | ICD-10-CM | POA: Diagnosis not present

## 2019-01-12 DIAGNOSIS — H53001 Unspecified amblyopia, right eye: Secondary | ICD-10-CM | POA: Diagnosis not present

## 2019-01-12 DIAGNOSIS — H04123 Dry eye syndrome of bilateral lacrimal glands: Secondary | ICD-10-CM | POA: Diagnosis not present

## 2019-01-12 DIAGNOSIS — H16223 Keratoconjunctivitis sicca, not specified as Sjogren's, bilateral: Secondary | ICD-10-CM | POA: Diagnosis not present

## 2019-01-12 DIAGNOSIS — H401134 Primary open-angle glaucoma, bilateral, indeterminate stage: Secondary | ICD-10-CM | POA: Diagnosis not present

## 2019-01-12 DIAGNOSIS — H4720 Unspecified optic atrophy: Secondary | ICD-10-CM | POA: Diagnosis not present

## 2019-01-18 DIAGNOSIS — H16002 Unspecified corneal ulcer, left eye: Secondary | ICD-10-CM | POA: Diagnosis not present

## 2019-01-18 DIAGNOSIS — H15092 Other scleritis, left eye: Secondary | ICD-10-CM | POA: Diagnosis not present

## 2019-01-19 DIAGNOSIS — F324 Major depressive disorder, single episode, in partial remission: Secondary | ICD-10-CM | POA: Diagnosis not present

## 2019-01-19 DIAGNOSIS — W19XXXA Unspecified fall, initial encounter: Secondary | ICD-10-CM | POA: Diagnosis not present

## 2019-01-19 DIAGNOSIS — H5316 Psychophysical visual disturbances: Secondary | ICD-10-CM | POA: Diagnosis not present

## 2019-01-19 DIAGNOSIS — S81812A Laceration without foreign body, left lower leg, initial encounter: Secondary | ICD-10-CM | POA: Diagnosis not present

## 2019-01-19 DIAGNOSIS — Y92090 Kitchen in other non-institutional residence as the place of occurrence of the external cause: Secondary | ICD-10-CM | POA: Diagnosis not present

## 2019-01-20 DIAGNOSIS — R7303 Prediabetes: Secondary | ICD-10-CM | POA: Diagnosis not present

## 2019-01-20 DIAGNOSIS — M8580 Other specified disorders of bone density and structure, unspecified site: Secondary | ICD-10-CM | POA: Diagnosis not present

## 2019-01-20 DIAGNOSIS — I10 Essential (primary) hypertension: Secondary | ICD-10-CM | POA: Diagnosis not present

## 2019-01-20 DIAGNOSIS — W19XXXD Unspecified fall, subsequent encounter: Secondary | ICD-10-CM | POA: Diagnosis not present

## 2019-01-20 DIAGNOSIS — Z9841 Cataract extraction status, right eye: Secondary | ICD-10-CM | POA: Diagnosis not present

## 2019-01-20 DIAGNOSIS — Z9842 Cataract extraction status, left eye: Secondary | ICD-10-CM | POA: Diagnosis not present

## 2019-01-20 DIAGNOSIS — S81812D Laceration without foreign body, left lower leg, subsequent encounter: Secondary | ICD-10-CM | POA: Diagnosis not present

## 2019-01-20 DIAGNOSIS — H5316 Psychophysical visual disturbances: Secondary | ICD-10-CM | POA: Diagnosis not present

## 2019-01-20 DIAGNOSIS — M170 Bilateral primary osteoarthritis of knee: Secondary | ICD-10-CM | POA: Diagnosis not present

## 2019-01-20 DIAGNOSIS — Z8673 Personal history of transient ischemic attack (TIA), and cerebral infarction without residual deficits: Secondary | ICD-10-CM | POA: Diagnosis not present

## 2019-01-20 DIAGNOSIS — H543 Unqualified visual loss, both eyes: Secondary | ICD-10-CM | POA: Diagnosis not present

## 2019-01-20 DIAGNOSIS — F17210 Nicotine dependence, cigarettes, uncomplicated: Secondary | ICD-10-CM | POA: Diagnosis not present

## 2019-01-20 DIAGNOSIS — Z7982 Long term (current) use of aspirin: Secondary | ICD-10-CM | POA: Diagnosis not present

## 2019-01-20 DIAGNOSIS — Z79899 Other long term (current) drug therapy: Secondary | ICD-10-CM | POA: Diagnosis not present

## 2019-01-20 DIAGNOSIS — F324 Major depressive disorder, single episode, in partial remission: Secondary | ICD-10-CM | POA: Diagnosis not present

## 2019-01-21 DIAGNOSIS — I10 Essential (primary) hypertension: Secondary | ICD-10-CM | POA: Diagnosis not present

## 2019-01-21 DIAGNOSIS — S81812D Laceration without foreign body, left lower leg, subsequent encounter: Secondary | ICD-10-CM | POA: Diagnosis not present

## 2019-01-21 DIAGNOSIS — H5316 Psychophysical visual disturbances: Secondary | ICD-10-CM | POA: Diagnosis not present

## 2019-01-21 DIAGNOSIS — M170 Bilateral primary osteoarthritis of knee: Secondary | ICD-10-CM | POA: Diagnosis not present

## 2019-01-21 DIAGNOSIS — H543 Unqualified visual loss, both eyes: Secondary | ICD-10-CM | POA: Diagnosis not present

## 2019-01-21 DIAGNOSIS — M8580 Other specified disorders of bone density and structure, unspecified site: Secondary | ICD-10-CM | POA: Diagnosis not present

## 2019-01-25 DIAGNOSIS — I10 Essential (primary) hypertension: Secondary | ICD-10-CM | POA: Diagnosis not present

## 2019-01-25 DIAGNOSIS — S81812D Laceration without foreign body, left lower leg, subsequent encounter: Secondary | ICD-10-CM | POA: Diagnosis not present

## 2019-01-25 DIAGNOSIS — M170 Bilateral primary osteoarthritis of knee: Secondary | ICD-10-CM | POA: Diagnosis not present

## 2019-01-25 DIAGNOSIS — M8580 Other specified disorders of bone density and structure, unspecified site: Secondary | ICD-10-CM | POA: Diagnosis not present

## 2019-01-25 DIAGNOSIS — H543 Unqualified visual loss, both eyes: Secondary | ICD-10-CM | POA: Diagnosis not present

## 2019-01-25 DIAGNOSIS — H5316 Psychophysical visual disturbances: Secondary | ICD-10-CM | POA: Diagnosis not present

## 2019-01-26 DIAGNOSIS — I10 Essential (primary) hypertension: Secondary | ICD-10-CM | POA: Diagnosis not present

## 2019-01-26 DIAGNOSIS — H5316 Psychophysical visual disturbances: Secondary | ICD-10-CM | POA: Diagnosis not present

## 2019-01-26 DIAGNOSIS — M170 Bilateral primary osteoarthritis of knee: Secondary | ICD-10-CM | POA: Diagnosis not present

## 2019-01-26 DIAGNOSIS — H543 Unqualified visual loss, both eyes: Secondary | ICD-10-CM | POA: Diagnosis not present

## 2019-01-26 DIAGNOSIS — M8580 Other specified disorders of bone density and structure, unspecified site: Secondary | ICD-10-CM | POA: Diagnosis not present

## 2019-01-26 DIAGNOSIS — S81812D Laceration without foreign body, left lower leg, subsequent encounter: Secondary | ICD-10-CM | POA: Diagnosis not present

## 2019-01-27 DIAGNOSIS — H5316 Psychophysical visual disturbances: Secondary | ICD-10-CM | POA: Diagnosis not present

## 2019-01-27 DIAGNOSIS — S81812D Laceration without foreign body, left lower leg, subsequent encounter: Secondary | ICD-10-CM | POA: Diagnosis not present

## 2019-01-27 DIAGNOSIS — M8580 Other specified disorders of bone density and structure, unspecified site: Secondary | ICD-10-CM | POA: Diagnosis not present

## 2019-01-27 DIAGNOSIS — I10 Essential (primary) hypertension: Secondary | ICD-10-CM | POA: Diagnosis not present

## 2019-01-27 DIAGNOSIS — M170 Bilateral primary osteoarthritis of knee: Secondary | ICD-10-CM | POA: Diagnosis not present

## 2019-01-27 DIAGNOSIS — H543 Unqualified visual loss, both eyes: Secondary | ICD-10-CM | POA: Diagnosis not present

## 2019-01-29 DIAGNOSIS — M170 Bilateral primary osteoarthritis of knee: Secondary | ICD-10-CM | POA: Diagnosis not present

## 2019-01-29 DIAGNOSIS — H543 Unqualified visual loss, both eyes: Secondary | ICD-10-CM | POA: Diagnosis not present

## 2019-01-29 DIAGNOSIS — I10 Essential (primary) hypertension: Secondary | ICD-10-CM | POA: Diagnosis not present

## 2019-01-29 DIAGNOSIS — H5316 Psychophysical visual disturbances: Secondary | ICD-10-CM | POA: Diagnosis not present

## 2019-01-29 DIAGNOSIS — M8580 Other specified disorders of bone density and structure, unspecified site: Secondary | ICD-10-CM | POA: Diagnosis not present

## 2019-01-29 DIAGNOSIS — S81812D Laceration without foreign body, left lower leg, subsequent encounter: Secondary | ICD-10-CM | POA: Diagnosis not present

## 2019-02-01 DIAGNOSIS — H543 Unqualified visual loss, both eyes: Secondary | ICD-10-CM | POA: Diagnosis not present

## 2019-02-01 DIAGNOSIS — M170 Bilateral primary osteoarthritis of knee: Secondary | ICD-10-CM | POA: Diagnosis not present

## 2019-02-01 DIAGNOSIS — M8580 Other specified disorders of bone density and structure, unspecified site: Secondary | ICD-10-CM | POA: Diagnosis not present

## 2019-02-01 DIAGNOSIS — S81812D Laceration without foreign body, left lower leg, subsequent encounter: Secondary | ICD-10-CM | POA: Diagnosis not present

## 2019-02-01 DIAGNOSIS — H5316 Psychophysical visual disturbances: Secondary | ICD-10-CM | POA: Diagnosis not present

## 2019-02-01 DIAGNOSIS — I10 Essential (primary) hypertension: Secondary | ICD-10-CM | POA: Diagnosis not present

## 2019-02-02 DIAGNOSIS — M170 Bilateral primary osteoarthritis of knee: Secondary | ICD-10-CM | POA: Diagnosis not present

## 2019-02-02 DIAGNOSIS — I10 Essential (primary) hypertension: Secondary | ICD-10-CM | POA: Diagnosis not present

## 2019-02-02 DIAGNOSIS — H5316 Psychophysical visual disturbances: Secondary | ICD-10-CM | POA: Diagnosis not present

## 2019-02-02 DIAGNOSIS — M8580 Other specified disorders of bone density and structure, unspecified site: Secondary | ICD-10-CM | POA: Diagnosis not present

## 2019-02-02 DIAGNOSIS — H543 Unqualified visual loss, both eyes: Secondary | ICD-10-CM | POA: Diagnosis not present

## 2019-02-02 DIAGNOSIS — S81812D Laceration without foreign body, left lower leg, subsequent encounter: Secondary | ICD-10-CM | POA: Diagnosis not present

## 2019-02-03 DIAGNOSIS — M170 Bilateral primary osteoarthritis of knee: Secondary | ICD-10-CM | POA: Diagnosis not present

## 2019-02-03 DIAGNOSIS — H5316 Psychophysical visual disturbances: Secondary | ICD-10-CM | POA: Diagnosis not present

## 2019-02-03 DIAGNOSIS — R410 Disorientation, unspecified: Secondary | ICD-10-CM | POA: Diagnosis not present

## 2019-02-03 DIAGNOSIS — F331 Major depressive disorder, recurrent, moderate: Secondary | ICD-10-CM | POA: Diagnosis not present

## 2019-02-03 DIAGNOSIS — M8580 Other specified disorders of bone density and structure, unspecified site: Secondary | ICD-10-CM | POA: Diagnosis not present

## 2019-02-03 DIAGNOSIS — I10 Essential (primary) hypertension: Secondary | ICD-10-CM | POA: Diagnosis not present

## 2019-02-03 DIAGNOSIS — H543 Unqualified visual loss, both eyes: Secondary | ICD-10-CM | POA: Diagnosis not present

## 2019-02-03 DIAGNOSIS — S81812D Laceration without foreign body, left lower leg, subsequent encounter: Secondary | ICD-10-CM | POA: Diagnosis not present

## 2019-02-03 DIAGNOSIS — L97921 Non-pressure chronic ulcer of unspecified part of left lower leg limited to breakdown of skin: Secondary | ICD-10-CM | POA: Diagnosis not present

## 2019-02-05 DIAGNOSIS — H5316 Psychophysical visual disturbances: Secondary | ICD-10-CM | POA: Diagnosis not present

## 2019-02-05 DIAGNOSIS — M8580 Other specified disorders of bone density and structure, unspecified site: Secondary | ICD-10-CM | POA: Diagnosis not present

## 2019-02-05 DIAGNOSIS — M170 Bilateral primary osteoarthritis of knee: Secondary | ICD-10-CM | POA: Diagnosis not present

## 2019-02-05 DIAGNOSIS — I10 Essential (primary) hypertension: Secondary | ICD-10-CM | POA: Diagnosis not present

## 2019-02-05 DIAGNOSIS — S81812D Laceration without foreign body, left lower leg, subsequent encounter: Secondary | ICD-10-CM | POA: Diagnosis not present

## 2019-02-05 DIAGNOSIS — H543 Unqualified visual loss, both eyes: Secondary | ICD-10-CM | POA: Diagnosis not present

## 2019-02-08 DIAGNOSIS — H543 Unqualified visual loss, both eyes: Secondary | ICD-10-CM | POA: Diagnosis not present

## 2019-02-08 DIAGNOSIS — S81812D Laceration without foreign body, left lower leg, subsequent encounter: Secondary | ICD-10-CM | POA: Diagnosis not present

## 2019-02-08 DIAGNOSIS — I10 Essential (primary) hypertension: Secondary | ICD-10-CM | POA: Diagnosis not present

## 2019-02-08 DIAGNOSIS — M8580 Other specified disorders of bone density and structure, unspecified site: Secondary | ICD-10-CM | POA: Diagnosis not present

## 2019-02-08 DIAGNOSIS — M170 Bilateral primary osteoarthritis of knee: Secondary | ICD-10-CM | POA: Diagnosis not present

## 2019-02-08 DIAGNOSIS — H5316 Psychophysical visual disturbances: Secondary | ICD-10-CM | POA: Diagnosis not present

## 2019-02-09 DIAGNOSIS — M170 Bilateral primary osteoarthritis of knee: Secondary | ICD-10-CM | POA: Diagnosis not present

## 2019-02-09 DIAGNOSIS — H543 Unqualified visual loss, both eyes: Secondary | ICD-10-CM | POA: Diagnosis not present

## 2019-02-09 DIAGNOSIS — M8580 Other specified disorders of bone density and structure, unspecified site: Secondary | ICD-10-CM | POA: Diagnosis not present

## 2019-02-09 DIAGNOSIS — I10 Essential (primary) hypertension: Secondary | ICD-10-CM | POA: Diagnosis not present

## 2019-02-09 DIAGNOSIS — S81812D Laceration without foreign body, left lower leg, subsequent encounter: Secondary | ICD-10-CM | POA: Diagnosis not present

## 2019-02-09 DIAGNOSIS — H5316 Psychophysical visual disturbances: Secondary | ICD-10-CM | POA: Diagnosis not present

## 2019-02-10 DIAGNOSIS — I10 Essential (primary) hypertension: Secondary | ICD-10-CM | POA: Diagnosis not present

## 2019-02-10 DIAGNOSIS — S81812D Laceration without foreign body, left lower leg, subsequent encounter: Secondary | ICD-10-CM | POA: Diagnosis not present

## 2019-02-10 DIAGNOSIS — H543 Unqualified visual loss, both eyes: Secondary | ICD-10-CM | POA: Diagnosis not present

## 2019-02-10 DIAGNOSIS — M8580 Other specified disorders of bone density and structure, unspecified site: Secondary | ICD-10-CM | POA: Diagnosis not present

## 2019-02-10 DIAGNOSIS — M170 Bilateral primary osteoarthritis of knee: Secondary | ICD-10-CM | POA: Diagnosis not present

## 2019-02-10 DIAGNOSIS — H5316 Psychophysical visual disturbances: Secondary | ICD-10-CM | POA: Diagnosis not present

## 2019-02-16 DIAGNOSIS — H543 Unqualified visual loss, both eyes: Secondary | ICD-10-CM | POA: Diagnosis not present

## 2019-02-16 DIAGNOSIS — M8580 Other specified disorders of bone density and structure, unspecified site: Secondary | ICD-10-CM | POA: Diagnosis not present

## 2019-02-16 DIAGNOSIS — S81812D Laceration without foreign body, left lower leg, subsequent encounter: Secondary | ICD-10-CM | POA: Diagnosis not present

## 2019-02-16 DIAGNOSIS — I10 Essential (primary) hypertension: Secondary | ICD-10-CM | POA: Diagnosis not present

## 2019-02-16 DIAGNOSIS — H5316 Psychophysical visual disturbances: Secondary | ICD-10-CM | POA: Diagnosis not present

## 2019-02-16 DIAGNOSIS — M170 Bilateral primary osteoarthritis of knee: Secondary | ICD-10-CM | POA: Diagnosis not present

## 2019-02-18 ENCOUNTER — Other Ambulatory Visit: Payer: Self-pay | Admitting: Family Medicine

## 2019-02-18 DIAGNOSIS — L97929 Non-pressure chronic ulcer of unspecified part of left lower leg with unspecified severity: Secondary | ICD-10-CM

## 2019-02-18 DIAGNOSIS — H5316 Psychophysical visual disturbances: Secondary | ICD-10-CM | POA: Diagnosis not present

## 2019-02-18 DIAGNOSIS — M8580 Other specified disorders of bone density and structure, unspecified site: Secondary | ICD-10-CM | POA: Diagnosis not present

## 2019-02-18 DIAGNOSIS — I10 Essential (primary) hypertension: Secondary | ICD-10-CM | POA: Diagnosis not present

## 2019-02-18 DIAGNOSIS — M170 Bilateral primary osteoarthritis of knee: Secondary | ICD-10-CM | POA: Diagnosis not present

## 2019-02-18 DIAGNOSIS — S81812D Laceration without foreign body, left lower leg, subsequent encounter: Secondary | ICD-10-CM | POA: Diagnosis not present

## 2019-02-18 DIAGNOSIS — H543 Unqualified visual loss, both eyes: Secondary | ICD-10-CM | POA: Diagnosis not present

## 2019-02-19 DIAGNOSIS — Z8673 Personal history of transient ischemic attack (TIA), and cerebral infarction without residual deficits: Secondary | ICD-10-CM | POA: Diagnosis not present

## 2019-02-19 DIAGNOSIS — F17210 Nicotine dependence, cigarettes, uncomplicated: Secondary | ICD-10-CM | POA: Diagnosis not present

## 2019-02-19 DIAGNOSIS — M170 Bilateral primary osteoarthritis of knee: Secondary | ICD-10-CM | POA: Diagnosis not present

## 2019-02-19 DIAGNOSIS — H543 Unqualified visual loss, both eyes: Secondary | ICD-10-CM | POA: Diagnosis not present

## 2019-02-19 DIAGNOSIS — Z7982 Long term (current) use of aspirin: Secondary | ICD-10-CM | POA: Diagnosis not present

## 2019-02-19 DIAGNOSIS — R7303 Prediabetes: Secondary | ICD-10-CM | POA: Diagnosis not present

## 2019-02-19 DIAGNOSIS — F324 Major depressive disorder, single episode, in partial remission: Secondary | ICD-10-CM | POA: Diagnosis not present

## 2019-02-19 DIAGNOSIS — S81812D Laceration without foreign body, left lower leg, subsequent encounter: Secondary | ICD-10-CM | POA: Diagnosis not present

## 2019-02-19 DIAGNOSIS — M8580 Other specified disorders of bone density and structure, unspecified site: Secondary | ICD-10-CM | POA: Diagnosis not present

## 2019-02-19 DIAGNOSIS — Z9842 Cataract extraction status, left eye: Secondary | ICD-10-CM | POA: Diagnosis not present

## 2019-02-19 DIAGNOSIS — H5316 Psychophysical visual disturbances: Secondary | ICD-10-CM | POA: Diagnosis not present

## 2019-02-19 DIAGNOSIS — W19XXXD Unspecified fall, subsequent encounter: Secondary | ICD-10-CM | POA: Diagnosis not present

## 2019-02-19 DIAGNOSIS — I10 Essential (primary) hypertension: Secondary | ICD-10-CM | POA: Diagnosis not present

## 2019-02-19 DIAGNOSIS — Z79899 Other long term (current) drug therapy: Secondary | ICD-10-CM | POA: Diagnosis not present

## 2019-02-19 DIAGNOSIS — Z9841 Cataract extraction status, right eye: Secondary | ICD-10-CM | POA: Diagnosis not present

## 2019-02-22 DIAGNOSIS — H5316 Psychophysical visual disturbances: Secondary | ICD-10-CM | POA: Diagnosis not present

## 2019-02-22 DIAGNOSIS — S81812D Laceration without foreign body, left lower leg, subsequent encounter: Secondary | ICD-10-CM | POA: Diagnosis not present

## 2019-02-22 DIAGNOSIS — M170 Bilateral primary osteoarthritis of knee: Secondary | ICD-10-CM | POA: Diagnosis not present

## 2019-02-22 DIAGNOSIS — I10 Essential (primary) hypertension: Secondary | ICD-10-CM | POA: Diagnosis not present

## 2019-02-22 DIAGNOSIS — M8580 Other specified disorders of bone density and structure, unspecified site: Secondary | ICD-10-CM | POA: Diagnosis not present

## 2019-02-22 DIAGNOSIS — H543 Unqualified visual loss, both eyes: Secondary | ICD-10-CM | POA: Diagnosis not present

## 2019-02-23 DIAGNOSIS — H543 Unqualified visual loss, both eyes: Secondary | ICD-10-CM | POA: Diagnosis not present

## 2019-02-23 DIAGNOSIS — S81812D Laceration without foreign body, left lower leg, subsequent encounter: Secondary | ICD-10-CM | POA: Diagnosis not present

## 2019-02-23 DIAGNOSIS — H5316 Psychophysical visual disturbances: Secondary | ICD-10-CM | POA: Diagnosis not present

## 2019-02-23 DIAGNOSIS — I10 Essential (primary) hypertension: Secondary | ICD-10-CM | POA: Diagnosis not present

## 2019-02-23 DIAGNOSIS — M8580 Other specified disorders of bone density and structure, unspecified site: Secondary | ICD-10-CM | POA: Diagnosis not present

## 2019-02-23 DIAGNOSIS — M170 Bilateral primary osteoarthritis of knee: Secondary | ICD-10-CM | POA: Diagnosis not present

## 2019-03-01 ENCOUNTER — Other Ambulatory Visit: Payer: Federal, State, Local not specified - PPO

## 2019-03-01 DIAGNOSIS — H5316 Psychophysical visual disturbances: Secondary | ICD-10-CM | POA: Diagnosis not present

## 2019-03-01 DIAGNOSIS — M8580 Other specified disorders of bone density and structure, unspecified site: Secondary | ICD-10-CM | POA: Diagnosis not present

## 2019-03-01 DIAGNOSIS — M170 Bilateral primary osteoarthritis of knee: Secondary | ICD-10-CM | POA: Diagnosis not present

## 2019-03-01 DIAGNOSIS — H543 Unqualified visual loss, both eyes: Secondary | ICD-10-CM | POA: Diagnosis not present

## 2019-03-01 DIAGNOSIS — I10 Essential (primary) hypertension: Secondary | ICD-10-CM | POA: Diagnosis not present

## 2019-03-01 DIAGNOSIS — S81812D Laceration without foreign body, left lower leg, subsequent encounter: Secondary | ICD-10-CM | POA: Diagnosis not present

## 2019-03-02 DIAGNOSIS — I10 Essential (primary) hypertension: Secondary | ICD-10-CM | POA: Diagnosis not present

## 2019-03-02 DIAGNOSIS — S81812D Laceration without foreign body, left lower leg, subsequent encounter: Secondary | ICD-10-CM | POA: Diagnosis not present

## 2019-03-02 DIAGNOSIS — M170 Bilateral primary osteoarthritis of knee: Secondary | ICD-10-CM | POA: Diagnosis not present

## 2019-03-02 DIAGNOSIS — H5316 Psychophysical visual disturbances: Secondary | ICD-10-CM | POA: Diagnosis not present

## 2019-03-02 DIAGNOSIS — H543 Unqualified visual loss, both eyes: Secondary | ICD-10-CM | POA: Diagnosis not present

## 2019-03-02 DIAGNOSIS — M8580 Other specified disorders of bone density and structure, unspecified site: Secondary | ICD-10-CM | POA: Diagnosis not present

## 2019-03-08 DIAGNOSIS — H5316 Psychophysical visual disturbances: Secondary | ICD-10-CM | POA: Diagnosis not present

## 2019-03-08 DIAGNOSIS — H543 Unqualified visual loss, both eyes: Secondary | ICD-10-CM | POA: Diagnosis not present

## 2019-03-08 DIAGNOSIS — I10 Essential (primary) hypertension: Secondary | ICD-10-CM | POA: Diagnosis not present

## 2019-03-08 DIAGNOSIS — M170 Bilateral primary osteoarthritis of knee: Secondary | ICD-10-CM | POA: Diagnosis not present

## 2019-03-08 DIAGNOSIS — S81812D Laceration without foreign body, left lower leg, subsequent encounter: Secondary | ICD-10-CM | POA: Diagnosis not present

## 2019-03-08 DIAGNOSIS — M8580 Other specified disorders of bone density and structure, unspecified site: Secondary | ICD-10-CM | POA: Diagnosis not present

## 2019-03-09 ENCOUNTER — Ambulatory Visit
Admission: RE | Admit: 2019-03-09 | Discharge: 2019-03-09 | Disposition: A | Discharge status: Home / Self Care | Payer: Medicare Other | Source: Ambulatory Visit | Attending: Family Medicine | Admitting: Family Medicine

## 2019-03-09 DIAGNOSIS — I743 Embolism and thrombosis of arteries of the lower extremities: Secondary | ICD-10-CM | POA: Diagnosis not present

## 2019-03-09 DIAGNOSIS — L97929 Non-pressure chronic ulcer of unspecified part of left lower leg with unspecified severity: Secondary | ICD-10-CM

## 2019-03-10 DIAGNOSIS — M170 Bilateral primary osteoarthritis of knee: Secondary | ICD-10-CM | POA: Diagnosis not present

## 2019-03-10 DIAGNOSIS — H543 Unqualified visual loss, both eyes: Secondary | ICD-10-CM | POA: Diagnosis not present

## 2019-03-10 DIAGNOSIS — H5316 Psychophysical visual disturbances: Secondary | ICD-10-CM | POA: Diagnosis not present

## 2019-03-10 DIAGNOSIS — I10 Essential (primary) hypertension: Secondary | ICD-10-CM | POA: Diagnosis not present

## 2019-03-10 DIAGNOSIS — S81812D Laceration without foreign body, left lower leg, subsequent encounter: Secondary | ICD-10-CM | POA: Diagnosis not present

## 2019-03-10 DIAGNOSIS — M8580 Other specified disorders of bone density and structure, unspecified site: Secondary | ICD-10-CM | POA: Diagnosis not present

## 2019-03-16 DIAGNOSIS — H5316 Psychophysical visual disturbances: Secondary | ICD-10-CM | POA: Diagnosis not present

## 2019-03-16 DIAGNOSIS — I10 Essential (primary) hypertension: Secondary | ICD-10-CM | POA: Diagnosis not present

## 2019-03-16 DIAGNOSIS — M170 Bilateral primary osteoarthritis of knee: Secondary | ICD-10-CM | POA: Diagnosis not present

## 2019-03-16 DIAGNOSIS — H543 Unqualified visual loss, both eyes: Secondary | ICD-10-CM | POA: Diagnosis not present

## 2019-03-16 DIAGNOSIS — M8580 Other specified disorders of bone density and structure, unspecified site: Secondary | ICD-10-CM | POA: Diagnosis not present

## 2019-03-16 DIAGNOSIS — S81812D Laceration without foreign body, left lower leg, subsequent encounter: Secondary | ICD-10-CM | POA: Diagnosis not present

## 2019-03-19 DIAGNOSIS — I10 Essential (primary) hypertension: Secondary | ICD-10-CM | POA: Diagnosis not present

## 2019-03-19 DIAGNOSIS — S81812D Laceration without foreign body, left lower leg, subsequent encounter: Secondary | ICD-10-CM | POA: Diagnosis not present

## 2019-03-19 DIAGNOSIS — M8580 Other specified disorders of bone density and structure, unspecified site: Secondary | ICD-10-CM | POA: Diagnosis not present

## 2019-03-19 DIAGNOSIS — H5316 Psychophysical visual disturbances: Secondary | ICD-10-CM | POA: Diagnosis not present

## 2019-03-19 DIAGNOSIS — M170 Bilateral primary osteoarthritis of knee: Secondary | ICD-10-CM | POA: Diagnosis not present

## 2019-03-19 DIAGNOSIS — H543 Unqualified visual loss, both eyes: Secondary | ICD-10-CM | POA: Diagnosis not present

## 2019-03-21 DIAGNOSIS — I10 Essential (primary) hypertension: Secondary | ICD-10-CM | POA: Diagnosis not present

## 2019-03-21 DIAGNOSIS — F324 Major depressive disorder, single episode, in partial remission: Secondary | ICD-10-CM | POA: Diagnosis not present

## 2019-03-21 DIAGNOSIS — H543 Unqualified visual loss, both eyes: Secondary | ICD-10-CM | POA: Diagnosis not present

## 2019-03-21 DIAGNOSIS — Z9181 History of falling: Secondary | ICD-10-CM | POA: Diagnosis not present

## 2019-03-21 DIAGNOSIS — M170 Bilateral primary osteoarthritis of knee: Secondary | ICD-10-CM | POA: Diagnosis not present

## 2019-03-21 DIAGNOSIS — Z79899 Other long term (current) drug therapy: Secondary | ICD-10-CM | POA: Diagnosis not present

## 2019-03-21 DIAGNOSIS — M8580 Other specified disorders of bone density and structure, unspecified site: Secondary | ICD-10-CM | POA: Diagnosis not present

## 2019-03-21 DIAGNOSIS — H5316 Psychophysical visual disturbances: Secondary | ICD-10-CM | POA: Diagnosis not present

## 2019-03-21 DIAGNOSIS — R7303 Prediabetes: Secondary | ICD-10-CM | POA: Diagnosis not present

## 2019-03-21 DIAGNOSIS — S81812D Laceration without foreign body, left lower leg, subsequent encounter: Secondary | ICD-10-CM | POA: Diagnosis not present

## 2019-03-21 DIAGNOSIS — W19XXXD Unspecified fall, subsequent encounter: Secondary | ICD-10-CM | POA: Diagnosis not present

## 2019-03-21 DIAGNOSIS — Z8673 Personal history of transient ischemic attack (TIA), and cerebral infarction without residual deficits: Secondary | ICD-10-CM | POA: Diagnosis not present

## 2019-03-21 DIAGNOSIS — F17210 Nicotine dependence, cigarettes, uncomplicated: Secondary | ICD-10-CM | POA: Diagnosis not present

## 2019-03-23 DIAGNOSIS — M170 Bilateral primary osteoarthritis of knee: Secondary | ICD-10-CM | POA: Diagnosis not present

## 2019-03-23 DIAGNOSIS — S81812D Laceration without foreign body, left lower leg, subsequent encounter: Secondary | ICD-10-CM | POA: Diagnosis not present

## 2019-03-23 DIAGNOSIS — H5316 Psychophysical visual disturbances: Secondary | ICD-10-CM | POA: Diagnosis not present

## 2019-03-23 DIAGNOSIS — I10 Essential (primary) hypertension: Secondary | ICD-10-CM | POA: Diagnosis not present

## 2019-03-23 DIAGNOSIS — M8580 Other specified disorders of bone density and structure, unspecified site: Secondary | ICD-10-CM | POA: Diagnosis not present

## 2019-03-23 DIAGNOSIS — H543 Unqualified visual loss, both eyes: Secondary | ICD-10-CM | POA: Diagnosis not present

## 2019-04-01 DIAGNOSIS — M170 Bilateral primary osteoarthritis of knee: Secondary | ICD-10-CM | POA: Diagnosis not present

## 2019-04-01 DIAGNOSIS — S81812D Laceration without foreign body, left lower leg, subsequent encounter: Secondary | ICD-10-CM | POA: Diagnosis not present

## 2019-04-01 DIAGNOSIS — H5316 Psychophysical visual disturbances: Secondary | ICD-10-CM | POA: Diagnosis not present

## 2019-04-01 DIAGNOSIS — H543 Unqualified visual loss, both eyes: Secondary | ICD-10-CM | POA: Diagnosis not present

## 2019-04-01 DIAGNOSIS — I10 Essential (primary) hypertension: Secondary | ICD-10-CM | POA: Diagnosis not present

## 2019-04-01 DIAGNOSIS — M8580 Other specified disorders of bone density and structure, unspecified site: Secondary | ICD-10-CM | POA: Diagnosis not present

## 2019-04-08 DIAGNOSIS — I10 Essential (primary) hypertension: Secondary | ICD-10-CM | POA: Diagnosis not present

## 2019-04-08 DIAGNOSIS — M8580 Other specified disorders of bone density and structure, unspecified site: Secondary | ICD-10-CM | POA: Diagnosis not present

## 2019-04-08 DIAGNOSIS — M170 Bilateral primary osteoarthritis of knee: Secondary | ICD-10-CM | POA: Diagnosis not present

## 2019-04-08 DIAGNOSIS — H543 Unqualified visual loss, both eyes: Secondary | ICD-10-CM | POA: Diagnosis not present

## 2019-04-08 DIAGNOSIS — S81812D Laceration without foreign body, left lower leg, subsequent encounter: Secondary | ICD-10-CM | POA: Diagnosis not present

## 2019-04-08 DIAGNOSIS — H5316 Psychophysical visual disturbances: Secondary | ICD-10-CM | POA: Diagnosis not present

## 2019-04-13 DIAGNOSIS — S81812D Laceration without foreign body, left lower leg, subsequent encounter: Secondary | ICD-10-CM | POA: Diagnosis not present

## 2019-04-13 DIAGNOSIS — M170 Bilateral primary osteoarthritis of knee: Secondary | ICD-10-CM | POA: Diagnosis not present

## 2019-04-13 DIAGNOSIS — H543 Unqualified visual loss, both eyes: Secondary | ICD-10-CM | POA: Diagnosis not present

## 2019-04-13 DIAGNOSIS — M8580 Other specified disorders of bone density and structure, unspecified site: Secondary | ICD-10-CM | POA: Diagnosis not present

## 2019-04-13 DIAGNOSIS — I10 Essential (primary) hypertension: Secondary | ICD-10-CM | POA: Diagnosis not present

## 2019-04-13 DIAGNOSIS — H5316 Psychophysical visual disturbances: Secondary | ICD-10-CM | POA: Diagnosis not present

## 2019-04-20 DIAGNOSIS — Z9842 Cataract extraction status, left eye: Secondary | ICD-10-CM | POA: Diagnosis not present

## 2019-04-20 DIAGNOSIS — S81812D Laceration without foreign body, left lower leg, subsequent encounter: Secondary | ICD-10-CM | POA: Diagnosis not present

## 2019-04-20 DIAGNOSIS — Z79899 Other long term (current) drug therapy: Secondary | ICD-10-CM | POA: Diagnosis not present

## 2019-04-20 DIAGNOSIS — F324 Major depressive disorder, single episode, in partial remission: Secondary | ICD-10-CM | POA: Diagnosis not present

## 2019-04-20 DIAGNOSIS — H543 Unqualified visual loss, both eyes: Secondary | ICD-10-CM | POA: Diagnosis not present

## 2019-04-20 DIAGNOSIS — W19XXXD Unspecified fall, subsequent encounter: Secondary | ICD-10-CM | POA: Diagnosis not present

## 2019-04-20 DIAGNOSIS — I10 Essential (primary) hypertension: Secondary | ICD-10-CM | POA: Diagnosis not present

## 2019-04-20 DIAGNOSIS — M170 Bilateral primary osteoarthritis of knee: Secondary | ICD-10-CM | POA: Diagnosis not present

## 2019-04-20 DIAGNOSIS — Z8673 Personal history of transient ischemic attack (TIA), and cerebral infarction without residual deficits: Secondary | ICD-10-CM | POA: Diagnosis not present

## 2019-04-20 DIAGNOSIS — Z7982 Long term (current) use of aspirin: Secondary | ICD-10-CM | POA: Diagnosis not present

## 2019-04-20 DIAGNOSIS — M8580 Other specified disorders of bone density and structure, unspecified site: Secondary | ICD-10-CM | POA: Diagnosis not present

## 2019-04-20 DIAGNOSIS — R7303 Prediabetes: Secondary | ICD-10-CM | POA: Diagnosis not present

## 2019-04-20 DIAGNOSIS — Z9841 Cataract extraction status, right eye: Secondary | ICD-10-CM | POA: Diagnosis not present

## 2019-04-20 DIAGNOSIS — H5316 Psychophysical visual disturbances: Secondary | ICD-10-CM | POA: Diagnosis not present

## 2019-04-20 DIAGNOSIS — Z9181 History of falling: Secondary | ICD-10-CM | POA: Diagnosis not present

## 2019-04-20 DIAGNOSIS — F17210 Nicotine dependence, cigarettes, uncomplicated: Secondary | ICD-10-CM | POA: Diagnosis not present

## 2019-04-27 DIAGNOSIS — H543 Unqualified visual loss, both eyes: Secondary | ICD-10-CM | POA: Diagnosis not present

## 2019-04-27 DIAGNOSIS — S81812D Laceration without foreign body, left lower leg, subsequent encounter: Secondary | ICD-10-CM | POA: Diagnosis not present

## 2019-04-27 DIAGNOSIS — H5316 Psychophysical visual disturbances: Secondary | ICD-10-CM | POA: Diagnosis not present

## 2019-04-27 DIAGNOSIS — M8580 Other specified disorders of bone density and structure, unspecified site: Secondary | ICD-10-CM | POA: Diagnosis not present

## 2019-04-27 DIAGNOSIS — I10 Essential (primary) hypertension: Secondary | ICD-10-CM | POA: Diagnosis not present

## 2019-04-27 DIAGNOSIS — M170 Bilateral primary osteoarthritis of knee: Secondary | ICD-10-CM | POA: Diagnosis not present

## 2019-05-04 DIAGNOSIS — M170 Bilateral primary osteoarthritis of knee: Secondary | ICD-10-CM | POA: Diagnosis not present

## 2019-05-04 DIAGNOSIS — M8580 Other specified disorders of bone density and structure, unspecified site: Secondary | ICD-10-CM | POA: Diagnosis not present

## 2019-05-04 DIAGNOSIS — H543 Unqualified visual loss, both eyes: Secondary | ICD-10-CM | POA: Diagnosis not present

## 2019-05-04 DIAGNOSIS — S81812D Laceration without foreign body, left lower leg, subsequent encounter: Secondary | ICD-10-CM | POA: Diagnosis not present

## 2019-05-04 DIAGNOSIS — H5316 Psychophysical visual disturbances: Secondary | ICD-10-CM | POA: Diagnosis not present

## 2019-05-04 DIAGNOSIS — I10 Essential (primary) hypertension: Secondary | ICD-10-CM | POA: Diagnosis not present

## 2019-05-12 DIAGNOSIS — S81812D Laceration without foreign body, left lower leg, subsequent encounter: Secondary | ICD-10-CM | POA: Diagnosis not present

## 2019-05-12 DIAGNOSIS — H5316 Psychophysical visual disturbances: Secondary | ICD-10-CM | POA: Diagnosis not present

## 2019-05-12 DIAGNOSIS — M8580 Other specified disorders of bone density and structure, unspecified site: Secondary | ICD-10-CM | POA: Diagnosis not present

## 2019-05-12 DIAGNOSIS — M170 Bilateral primary osteoarthritis of knee: Secondary | ICD-10-CM | POA: Diagnosis not present

## 2019-05-12 DIAGNOSIS — I10 Essential (primary) hypertension: Secondary | ICD-10-CM | POA: Diagnosis not present

## 2019-05-12 DIAGNOSIS — H543 Unqualified visual loss, both eyes: Secondary | ICD-10-CM | POA: Diagnosis not present

## 2019-05-14 DIAGNOSIS — H1820 Unspecified corneal edema: Secondary | ICD-10-CM | POA: Diagnosis not present

## 2019-05-14 DIAGNOSIS — H16002 Unspecified corneal ulcer, left eye: Secondary | ICD-10-CM | POA: Diagnosis not present

## 2019-06-01 DIAGNOSIS — E7800 Pure hypercholesterolemia, unspecified: Secondary | ICD-10-CM | POA: Diagnosis not present

## 2019-06-01 DIAGNOSIS — D692 Other nonthrombocytopenic purpura: Secondary | ICD-10-CM | POA: Diagnosis not present

## 2019-06-01 DIAGNOSIS — R202 Paresthesia of skin: Secondary | ICD-10-CM | POA: Diagnosis not present

## 2019-06-01 DIAGNOSIS — Z0000 Encounter for general adult medical examination without abnormal findings: Secondary | ICD-10-CM | POA: Diagnosis not present

## 2019-06-01 DIAGNOSIS — M545 Low back pain: Secondary | ICD-10-CM | POA: Diagnosis not present

## 2019-06-01 DIAGNOSIS — E46 Unspecified protein-calorie malnutrition: Secondary | ICD-10-CM | POA: Diagnosis not present

## 2019-06-01 DIAGNOSIS — F330 Major depressive disorder, recurrent, mild: Secondary | ICD-10-CM | POA: Diagnosis not present

## 2019-06-01 DIAGNOSIS — F17210 Nicotine dependence, cigarettes, uncomplicated: Secondary | ICD-10-CM | POA: Diagnosis not present

## 2019-06-01 DIAGNOSIS — I10 Essential (primary) hypertension: Secondary | ICD-10-CM | POA: Diagnosis not present

## 2019-06-01 DIAGNOSIS — E2839 Other primary ovarian failure: Secondary | ICD-10-CM | POA: Diagnosis not present

## 2019-06-01 DIAGNOSIS — M810 Age-related osteoporosis without current pathological fracture: Secondary | ICD-10-CM | POA: Diagnosis not present

## 2019-06-03 ENCOUNTER — Other Ambulatory Visit: Payer: Self-pay | Admitting: Family Medicine

## 2019-06-03 DIAGNOSIS — M810 Age-related osteoporosis without current pathological fracture: Secondary | ICD-10-CM

## 2019-06-10 DIAGNOSIS — R404 Transient alteration of awareness: Secondary | ICD-10-CM | POA: Diagnosis not present

## 2019-06-10 DIAGNOSIS — M5489 Other dorsalgia: Secondary | ICD-10-CM | POA: Diagnosis not present

## 2019-06-10 DIAGNOSIS — I499 Cardiac arrhythmia, unspecified: Secondary | ICD-10-CM | POA: Diagnosis not present

## 2019-06-10 DIAGNOSIS — I491 Atrial premature depolarization: Secondary | ICD-10-CM | POA: Diagnosis not present

## 2019-06-10 DIAGNOSIS — R52 Pain, unspecified: Secondary | ICD-10-CM | POA: Diagnosis not present

## 2019-06-16 DIAGNOSIS — E7800 Pure hypercholesterolemia, unspecified: Secondary | ICD-10-CM | POA: Diagnosis not present

## 2019-06-16 DIAGNOSIS — F22 Delusional disorders: Secondary | ICD-10-CM | POA: Diagnosis not present

## 2019-06-16 DIAGNOSIS — I10 Essential (primary) hypertension: Secondary | ICD-10-CM | POA: Diagnosis not present

## 2019-06-16 DIAGNOSIS — R350 Frequency of micturition: Secondary | ICD-10-CM | POA: Diagnosis not present

## 2019-07-13 DIAGNOSIS — H401134 Primary open-angle glaucoma, bilateral, indeterminate stage: Secondary | ICD-10-CM | POA: Diagnosis not present

## 2019-07-13 DIAGNOSIS — H16223 Keratoconjunctivitis sicca, not specified as Sjogren's, bilateral: Secondary | ICD-10-CM | POA: Diagnosis not present

## 2019-07-13 DIAGNOSIS — H04123 Dry eye syndrome of bilateral lacrimal glands: Secondary | ICD-10-CM | POA: Diagnosis not present

## 2019-07-13 DIAGNOSIS — H16012 Central corneal ulcer, left eye: Secondary | ICD-10-CM | POA: Diagnosis not present

## 2019-08-03 ENCOUNTER — Encounter: Payer: Self-pay | Admitting: Neurology

## 2019-09-27 DIAGNOSIS — M160 Bilateral primary osteoarthritis of hip: Secondary | ICD-10-CM | POA: Diagnosis not present

## 2019-09-27 DIAGNOSIS — E7800 Pure hypercholesterolemia, unspecified: Secondary | ICD-10-CM | POA: Diagnosis not present

## 2019-09-27 DIAGNOSIS — I10 Essential (primary) hypertension: Secondary | ICD-10-CM | POA: Diagnosis not present

## 2019-09-27 DIAGNOSIS — F331 Major depressive disorder, recurrent, moderate: Secondary | ICD-10-CM | POA: Diagnosis not present

## 2019-09-27 DIAGNOSIS — F324 Major depressive disorder, single episode, in partial remission: Secondary | ICD-10-CM | POA: Diagnosis not present

## 2019-09-27 DIAGNOSIS — M810 Age-related osteoporosis without current pathological fracture: Secondary | ICD-10-CM | POA: Diagnosis not present

## 2019-09-27 DIAGNOSIS — M8580 Other specified disorders of bone density and structure, unspecified site: Secondary | ICD-10-CM | POA: Diagnosis not present

## 2019-09-27 DIAGNOSIS — F330 Major depressive disorder, recurrent, mild: Secondary | ICD-10-CM | POA: Diagnosis not present

## 2019-09-27 DIAGNOSIS — M170 Bilateral primary osteoarthritis of knee: Secondary | ICD-10-CM | POA: Diagnosis not present

## 2019-09-27 DIAGNOSIS — I639 Cerebral infarction, unspecified: Secondary | ICD-10-CM | POA: Diagnosis not present

## 2019-10-06 ENCOUNTER — Inpatient Hospital Stay (HOSPITAL_COMMUNITY)
Admission: EM | Admit: 2019-10-06 | Discharge: 2019-10-12 | DRG: 091 | Disposition: A | Discharge status: Home / Self Care | Payer: Medicare Other | Attending: Internal Medicine | Admitting: Internal Medicine

## 2019-10-06 ENCOUNTER — Encounter (HOSPITAL_COMMUNITY): Payer: Self-pay | Admitting: Internal Medicine

## 2019-10-06 ENCOUNTER — Emergency Department (HOSPITAL_COMMUNITY): Payer: Medicare Other

## 2019-10-06 ENCOUNTER — Other Ambulatory Visit: Payer: Self-pay

## 2019-10-06 DIAGNOSIS — J069 Acute upper respiratory infection, unspecified: Secondary | ICD-10-CM | POA: Diagnosis not present

## 2019-10-06 DIAGNOSIS — R531 Weakness: Secondary | ICD-10-CM

## 2019-10-06 DIAGNOSIS — R2681 Unsteadiness on feet: Secondary | ICD-10-CM | POA: Diagnosis not present

## 2019-10-06 DIAGNOSIS — R55 Syncope and collapse: Secondary | ICD-10-CM | POA: Diagnosis not present

## 2019-10-06 DIAGNOSIS — Z6820 Body mass index (BMI) 20.0-20.9, adult: Secondary | ICD-10-CM | POA: Diagnosis not present

## 2019-10-06 DIAGNOSIS — R9431 Abnormal electrocardiogram [ECG] [EKG]: Secondary | ICD-10-CM | POA: Diagnosis not present

## 2019-10-06 DIAGNOSIS — H547 Unspecified visual loss: Secondary | ICD-10-CM | POA: Diagnosis present

## 2019-10-06 DIAGNOSIS — I959 Hypotension, unspecified: Secondary | ICD-10-CM | POA: Diagnosis not present

## 2019-10-06 DIAGNOSIS — Z20822 Contact with and (suspected) exposure to covid-19: Secondary | ICD-10-CM | POA: Diagnosis present

## 2019-10-06 DIAGNOSIS — Z515 Encounter for palliative care: Secondary | ICD-10-CM

## 2019-10-06 DIAGNOSIS — N390 Urinary tract infection, site not specified: Secondary | ICD-10-CM | POA: Diagnosis present

## 2019-10-06 DIAGNOSIS — L8990 Pressure ulcer of unspecified site, unspecified stage: Secondary | ICD-10-CM | POA: Diagnosis present

## 2019-10-06 DIAGNOSIS — F0390 Unspecified dementia without behavioral disturbance: Secondary | ICD-10-CM | POA: Diagnosis present

## 2019-10-06 DIAGNOSIS — E876 Hypokalemia: Secondary | ICD-10-CM | POA: Diagnosis present

## 2019-10-06 DIAGNOSIS — M2550 Pain in unspecified joint: Secondary | ICD-10-CM | POA: Diagnosis not present

## 2019-10-06 DIAGNOSIS — I700 Atherosclerosis of aorta: Secondary | ICD-10-CM | POA: Diagnosis not present

## 2019-10-06 DIAGNOSIS — E860 Dehydration: Secondary | ICD-10-CM | POA: Diagnosis present

## 2019-10-06 DIAGNOSIS — J189 Pneumonia, unspecified organism: Secondary | ICD-10-CM | POA: Diagnosis present

## 2019-10-06 DIAGNOSIS — F329 Major depressive disorder, single episode, unspecified: Secondary | ICD-10-CM | POA: Diagnosis present

## 2019-10-06 DIAGNOSIS — L89322 Pressure ulcer of left buttock, stage 2: Secondary | ICD-10-CM | POA: Diagnosis present

## 2019-10-06 DIAGNOSIS — M6289 Other specified disorders of muscle: Secondary | ICD-10-CM | POA: Diagnosis not present

## 2019-10-06 DIAGNOSIS — I10 Essential (primary) hypertension: Secondary | ICD-10-CM | POA: Diagnosis present

## 2019-10-06 DIAGNOSIS — R195 Other fecal abnormalities: Secondary | ICD-10-CM | POA: Diagnosis present

## 2019-10-06 DIAGNOSIS — Z7189 Other specified counseling: Secondary | ICD-10-CM | POA: Diagnosis not present

## 2019-10-06 DIAGNOSIS — R41841 Cognitive communication deficit: Secondary | ICD-10-CM | POA: Diagnosis not present

## 2019-10-06 DIAGNOSIS — J90 Pleural effusion, not elsewhere classified: Secondary | ICD-10-CM | POA: Diagnosis not present

## 2019-10-06 DIAGNOSIS — N179 Acute kidney failure, unspecified: Secondary | ICD-10-CM | POA: Diagnosis present

## 2019-10-06 DIAGNOSIS — Z7983 Long term (current) use of bisphosphonates: Secondary | ICD-10-CM

## 2019-10-06 DIAGNOSIS — R000 Tachycardia, unspecified: Secondary | ICD-10-CM | POA: Diagnosis not present

## 2019-10-06 DIAGNOSIS — Z7401 Bed confinement status: Secondary | ICD-10-CM | POA: Diagnosis not present

## 2019-10-06 DIAGNOSIS — R4182 Altered mental status, unspecified: Secondary | ICD-10-CM

## 2019-10-06 DIAGNOSIS — E43 Unspecified severe protein-calorie malnutrition: Secondary | ICD-10-CM | POA: Diagnosis not present

## 2019-10-06 DIAGNOSIS — Z87891 Personal history of nicotine dependence: Secondary | ICD-10-CM | POA: Diagnosis not present

## 2019-10-06 DIAGNOSIS — I739 Peripheral vascular disease, unspecified: Secondary | ICD-10-CM | POA: Diagnosis not present

## 2019-10-06 DIAGNOSIS — R404 Transient alteration of awareness: Secondary | ICD-10-CM | POA: Diagnosis not present

## 2019-10-06 DIAGNOSIS — G92 Toxic encephalopathy: Principal | ICD-10-CM | POA: Diagnosis present

## 2019-10-06 DIAGNOSIS — I69828 Other speech and language deficits following other cerebrovascular disease: Secondary | ICD-10-CM | POA: Diagnosis not present

## 2019-10-06 DIAGNOSIS — D509 Iron deficiency anemia, unspecified: Secondary | ICD-10-CM | POA: Diagnosis present

## 2019-10-06 DIAGNOSIS — M6281 Muscle weakness (generalized): Secondary | ICD-10-CM | POA: Diagnosis not present

## 2019-10-06 DIAGNOSIS — B9689 Other specified bacterial agents as the cause of diseases classified elsewhere: Secondary | ICD-10-CM | POA: Diagnosis not present

## 2019-10-06 DIAGNOSIS — Z66 Do not resuscitate: Secondary | ICD-10-CM | POA: Diagnosis present

## 2019-10-06 DIAGNOSIS — G9340 Encephalopathy, unspecified: Secondary | ICD-10-CM | POA: Diagnosis not present

## 2019-10-06 DIAGNOSIS — Z79899 Other long term (current) drug therapy: Secondary | ICD-10-CM

## 2019-10-06 DIAGNOSIS — G319 Degenerative disease of nervous system, unspecified: Secondary | ICD-10-CM | POA: Diagnosis not present

## 2019-10-06 DIAGNOSIS — R5381 Other malaise: Secondary | ICD-10-CM | POA: Diagnosis not present

## 2019-10-06 DIAGNOSIS — R627 Adult failure to thrive: Secondary | ICD-10-CM | POA: Diagnosis present

## 2019-10-06 DIAGNOSIS — Z03818 Encounter for observation for suspected exposure to other biological agents ruled out: Secondary | ICD-10-CM | POA: Diagnosis not present

## 2019-10-06 DIAGNOSIS — G9341 Metabolic encephalopathy: Secondary | ICD-10-CM | POA: Diagnosis not present

## 2019-10-06 DIAGNOSIS — I6782 Cerebral ischemia: Secondary | ICD-10-CM | POA: Diagnosis not present

## 2019-10-06 DIAGNOSIS — I491 Atrial premature depolarization: Secondary | ICD-10-CM | POA: Diagnosis not present

## 2019-10-06 HISTORY — DX: Unspecified dementia, unspecified severity, without behavioral disturbance, psychotic disturbance, mood disturbance, and anxiety: F03.90

## 2019-10-06 HISTORY — DX: Essential (primary) hypertension: I10

## 2019-10-06 LAB — SARS CORONAVIRUS 2 BY RT PCR (HOSPITAL ORDER, PERFORMED IN ~~LOC~~ HOSPITAL LAB): SARS Coronavirus 2: NEGATIVE

## 2019-10-06 LAB — URINALYSIS, ROUTINE W REFLEX MICROSCOPIC
Bilirubin Urine: NEGATIVE
Glucose, UA: NEGATIVE mg/dL
Hgb urine dipstick: NEGATIVE
Ketones, ur: NEGATIVE mg/dL
Nitrite: NEGATIVE
Protein, ur: NEGATIVE mg/dL
Specific Gravity, Urine: 1.023 (ref 1.005–1.030)
pH: 5 (ref 5.0–8.0)

## 2019-10-06 LAB — CBC WITH DIFFERENTIAL/PLATELET
Abs Immature Granulocytes: 0.07 10*3/uL (ref 0.00–0.07)
Basophils Absolute: 0.1 10*3/uL (ref 0.0–0.1)
Basophils Relative: 0 %
Eosinophils Absolute: 0 10*3/uL (ref 0.0–0.5)
Eosinophils Relative: 0 %
HCT: 30.3 % — ABNORMAL LOW (ref 36.0–46.0)
Hemoglobin: 9 g/dL — ABNORMAL LOW (ref 12.0–15.0)
Immature Granulocytes: 1 %
Lymphocytes Relative: 6 %
Lymphs Abs: 0.8 10*3/uL (ref 0.7–4.0)
MCH: 23 pg — ABNORMAL LOW (ref 26.0–34.0)
MCHC: 29.7 g/dL — ABNORMAL LOW (ref 30.0–36.0)
MCV: 77.3 fL — ABNORMAL LOW (ref 80.0–100.0)
Monocytes Absolute: 0.9 10*3/uL (ref 0.1–1.0)
Monocytes Relative: 6 %
Neutro Abs: 12.7 10*3/uL — ABNORMAL HIGH (ref 1.7–7.7)
Neutrophils Relative %: 87 %
Platelets: 342 10*3/uL (ref 150–400)
RBC: 3.92 MIL/uL (ref 3.87–5.11)
RDW: 17.3 % — ABNORMAL HIGH (ref 11.5–15.5)
WBC: 14.5 10*3/uL — ABNORMAL HIGH (ref 4.0–10.5)
nRBC: 0 % (ref 0.0–0.2)

## 2019-10-06 LAB — COMPREHENSIVE METABOLIC PANEL
ALT: 13 U/L (ref 0–44)
AST: 23 U/L (ref 15–41)
Albumin: 2.5 g/dL — ABNORMAL LOW (ref 3.5–5.0)
Alkaline Phosphatase: 67 U/L (ref 38–126)
Anion gap: 9 (ref 5–15)
BUN: 28 mg/dL — ABNORMAL HIGH (ref 8–23)
CO2: 24 mmol/L (ref 22–32)
Calcium: 8.9 mg/dL (ref 8.9–10.3)
Chloride: 108 mmol/L (ref 98–111)
Creatinine, Ser: 0.77 mg/dL (ref 0.44–1.00)
GFR calc Af Amer: >60 mL/min (ref >60)
GFR calc non Af Amer: >60 mL/min (ref >60)
Glucose, Bld: 120 mg/dL — ABNORMAL HIGH (ref 70–99)
Potassium: 3.8 mmol/L (ref 3.5–5.1)
Sodium: 141 mmol/L (ref 135–145)
Total Bilirubin: 0.6 mg/dL (ref 0.3–1.2)
Total Protein: 5.8 g/dL — ABNORMAL LOW (ref 6.5–8.1)

## 2019-10-06 LAB — LACTIC ACID, PLASMA: Lactic Acid, Venous: 1.5 mmol/L (ref 0.5–1.9)

## 2019-10-06 LAB — LIPASE, BLOOD: Lipase: 18 U/L (ref 11–51)

## 2019-10-06 LAB — CBG MONITORING, ED: Glucose-Capillary: 122 mg/dL — ABNORMAL HIGH (ref 70–99)

## 2019-10-06 MED ORDER — NETARSUDIL-LATANOPROST 0.02-0.005 % OP SOLN
1.0000 [drp] | Freq: Every day | OPHTHALMIC | Status: DC
  Administered 2019-10-10 – 2019-10-11 (×2): 1 [drp] via OPHTHALMIC

## 2019-10-06 MED ORDER — SODIUM CHLORIDE 0.9 % IV SOLN
1.0000 g | Freq: Once | INTRAVENOUS | Status: AC
  Administered 2019-10-06: 1 g via INTRAVENOUS
  Filled 2019-10-06: qty 10

## 2019-10-06 MED ORDER — ONDANSETRON HCL 4 MG PO TABS: 4.0000 mg | ORAL_TABLET | Freq: Four times a day (QID) | ORAL | Status: DC | PRN

## 2019-10-06 MED ORDER — DEXTROSE-NACL 5-0.9 % IV SOLN: INTRAVENOUS | Status: AC

## 2019-10-06 MED ORDER — SODIUM CHLORIDE 0.9 % IV SOLN: INTRAVENOUS | Status: DC

## 2019-10-06 MED ORDER — SODIUM CHLORIDE 0.9 % IV SOLN
2.0000 g | INTRAVENOUS | Status: DC
  Administered 2019-10-07: 2 g via INTRAVENOUS
  Filled 2019-10-06: qty 2

## 2019-10-06 MED ORDER — HYDRALAZINE HCL 20 MG/ML IJ SOLN: 10.0000 mg | INTRAMUSCULAR | Status: DC | PRN

## 2019-10-06 MED ORDER — ONDANSETRON HCL 4 MG/2ML IJ SOLN: 4.0000 mg | Freq: Four times a day (QID) | INTRAMUSCULAR | Status: DC | PRN

## 2019-10-06 MED ORDER — SODIUM CHLORIDE 0.9 % IV BOLUS
500.0000 mL | Freq: Once | INTRAVENOUS | Status: AC
  Administered 2019-10-06: 500 mL via INTRAVENOUS

## 2019-10-06 MED ORDER — SODIUM CHLORIDE 0.9 % IV SOLN
500.0000 mg | Freq: Once | INTRAVENOUS | Status: AC
  Administered 2019-10-06: 500 mg via INTRAVENOUS
  Filled 2019-10-06: qty 500

## 2019-10-06 MED ORDER — SODIUM CHLORIDE 0.9 % IV SOLN
500.0000 mg | INTRAVENOUS | Status: DC
  Administered 2019-10-07: 500 mg via INTRAVENOUS
  Filled 2019-10-06: qty 500

## 2019-10-06 NOTE — ED Notes (Signed)
PLEASE CALL PTS DAUGHTER SUSAN LULHAM FOR UPDATE- SHE IS WAITING OUTSIDE [phone]

## 2019-10-06 NOTE — ED Provider Notes (Signed)
[facility] EMERGENCY DEPARTMENT Provider Note   CSN: [id number] Arrival date & time: 10/06/19  1719     History Chief Complaint  Patient presents with  . Near Syncope    Autumn Wells is a 86 y.o. female.  Patient brought in by EMS from home.  Daughter arrived shortly after she arrived.  Daughter states that her mother has been kind of out of it for the past week.  She does have a history of dementia but this is much more severe.  She not wanting to eat or drink.  Daughter is fearful that she really has not had hardly anything to eat or drink.  She just sleeps all the time.  Patient arousable but nonverbal upon arrival.  Blood sugar was checked and was okay.  Patient looked very dry.        No past medical history on file.  Patient Active Problem List   Diagnosis Date Noted  . Acute encephalopathy 10/06/2019       OB History   No obstetric history on file.     No family history on file.  Social History   Tobacco Use  . Smoking status: Not on file  Substance Use Topics  . Alcohol use: Not on file  . Drug use: Not on file    Home Medications Prior to Admission medications   Medication Sig Start Date End Date Taking? Authorizing Provider  acetaminophen (TYLENOL) 500 MG tablet Take 500 mg by mouth daily as needed for mild pain (aches.).   Yes [provider]  alendronate (FOSAMAX) 70 MG tablet Take 70 mg by mouth once a week. 06/06/19  Yes [provider]  Brinzolamide-Brimonidine (SIMBRINZA) 1-0.2 % SUSP Apply 1 drop to eye in the morning, at noon, and at bedtime. Right eye.   Yes [provider]  DULoxetine (CYMBALTA) 30 MG capsule Take 30 mg by mouth daily. 10/01/19  Yes [provider]  lisinopril (ZESTRIL) 20 MG tablet Take 20 mg by mouth every morning. 09/02/19  Yes [provider]  Multiple Vitamin (MULTIVITAMIN) tablet Take 1 tablet by mouth daily.   Yes [provider]  Netarsudil-Latanoprost  (ROCKLATAN) 0.02-0.005 % SOLN Apply 1 drop to eye at bedtime. Right eye.   Yes [provider]  risperiDONE (RISPERDAL) 1 MG tablet Take 1-3 mg by mouth 2 (two) times daily. 1mg  am  3mg  hs. 10/05/19  Yes [provider]  atorvastatin (LIPITOR) 20 MG tablet Take 20 mg by mouth once a week. Patient not taking: Reported on 10/06/2019 06/01/19   [provider]  azaTHIOprine (IMURAN) 50 MG tablet Take 50 mg by mouth daily. Patient not taking: Reported on 10/06/2019 04/21/19   [provider]    Allergies    Patient has no known allergies.  Review of Systems   Review of Systems  Unable to perform ROS: Mental status change    Physical Exam Updated Vital Signs BP (!) 110/49   Pulse 69   Temp 98 F (36.7 C)   Resp 15   SpO2 99%   Physical Exam Vitals and nursing note reviewed.  Constitutional:      General: She is not in acute distress.    Appearance: She is well-developed. She is ill-appearing. She is not diaphoretic.  HENT:     Head: Normocephalic and atraumatic.     Mouth/Throat:     Mouth: Mucous membranes are dry.  Eyes:     Extraocular Movements: Extraocular movements intact.  Conjunctiva/sclera: Conjunctivae normal.     Pupils: Pupils are equal, round, and reactive to light.     Comments: Some crusting on the eyes right greater than left.  Cardiovascular:     Rate and Rhythm: Normal rate. Rhythm irregular.     Heart sounds: No murmur heard.   Pulmonary:     Effort: Pulmonary effort is normal. No respiratory distress.     Breath sounds: Normal breath sounds.  Abdominal:     Palpations: Abdomen is soft.     Tenderness: There is no abdominal tenderness.  Musculoskeletal:        General: No swelling.     Cervical back: Neck supple.  Skin:    General: Skin is warm and dry.     Capillary Refill: Capillary refill takes less than 2 seconds.  Neurological:     Mental Status: She is alert.     Comments: Patient will open eyes to stimulus.   Otherwise not following commands.  Seems to be very sleepy.     ED Results / Procedures / Treatments   Labs (all labs ordered are listed, but only abnormal results are displayed) Labs Reviewed  COMPREHENSIVE METABOLIC PANEL - Abnormal; Notable for the following components:      Result Value   Glucose, Bld 120 (*)    BUN 28 (*)    Total Protein 5.8 (*)    Albumin 2.5 (*)    All other components within normal limits  CBC WITH DIFFERENTIAL/PLATELET - Abnormal; Notable for the following components:   WBC 14.5 (*)    Hemoglobin 9.0 (*)    HCT 30.3 (*)    MCV 77.3 (*)    MCH 23.0 (*)    MCHC 29.7 (*)    RDW 17.3 (*)    Neutro Abs 12.7 (*)    All other components within normal limits  CBG MONITORING, ED - Abnormal; Notable for the following components:   Glucose-Capillary 122 (*)    All other components within normal limits  SARS CORONAVIRUS 2 BY RT PCR (HOSPITAL ORDER, [facility])  URINE CULTURE  CULTURE, BLOOD (ROUTINE X 2)  CULTURE, BLOOD (ROUTINE X 2)  LIPASE, BLOOD  URINALYSIS, ROUTINE W REFLEX MICROSCOPIC  LACTIC ACID, PLASMA    EKG EKG Interpretation  Date/Time:  Wednesday October 06 2019 17:54:26 EDT Ventricular Rate:  144 PR Interval:    QRS Duration: 157 QT Interval:  411 QTC Calculation: 450 R Axis:   -49 Text Interpretation: Interpretation limited secondary to artifact Artifact in lead(s) I II III aVR aVL aVF V1 V2 V3 V4 V6 No previous ECGs available Confirmed by Fredia Sorrow [phone]) on 10/06/2019 6:29:15 PM   Radiology CT Head Wo Contrast  Result Date: 10/06/2019 CLINICAL DATA:  Altered mental status EXAM: CT HEAD WITHOUT CONTRAST TECHNIQUE: Contiguous axial images were obtained from the base of the skull through the vertex without intravenous contrast. COMPARISON:  MRI, CT 06/15/2003 FINDINGS: Brain: There is an indeterminate rounded 5 mm hyperdense focus along the lateral left pons. Sequela of remote left pontine lacunar infarct is  noted in a similar distribution to comparison MRI. Suspect remote bilateral basal ganglia infarcts. No evidence of acute infarction, hemorrhage, hydrocephalus, extra-axial collection or midline shift. Symmetric prominence of the ventricles, cisterns and sulci compatible with parenchymal volume loss. Patchy areas of white matter hypoattenuation are most compatible with chronic microvascular angiopathy. Vascular: Atherosclerotic calcification of the carotid siphons. No hyperdense vessel. Skull: No calvarial fracture or suspicious osseous  lesion. No scalp swelling or hematoma. Sinuses/Orbits: Shrunken hyperdense appearance of the left globe is new since 2005. 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 No acute abnormality of the included orbits. Atherosclerotic calcification of the carotid siphons. No hyperdense vessel. Other: None IMPRESSION: 1. Indeterminate rounded 5 mm hyperdense focus along the lateral left pons. While this may represent a cavernous malformation, dolichoectasia of a crossing vessel or other indeterminate lesion, incompletely assessed given the amount of streak at the level of the skull base. Could be further visualized with MRI with contrast if patient is able to tolerate. 2. 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 3. Chronic microvascular angiopathy and parenchymal volume loss. 4. Shrunken hyperdense appearance of the left globe is new since 2005. 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 Electronically Signed   By: Lovena Le M.D.   On: 10/06/2019 20:14   DG Chest Port 1 View  Result Date: 10/06/2019 CLINICAL DATA:  Near syncope. EXAM: PORTABLE CHEST 1 VIEW COMPARISON:  None FINDINGS: The lungs are hyperinflated. Mild to moderate severity atelectasis and/or infiltrate is seen within the right lung base. There is a small right pleural effusion. No pneumothorax is identified. The heart size and mediastinal contours  are within normal limits. There is marked severity calcification of the aortic arch. Multilevel degenerative changes seen throughout the thoracic spine. IMPRESSION: 1. Mild to moderate severity right basilar atelectasis and/or infiltrate. 2. Small right pleural effusion. Electronically Signed   By: Virgina Norfolk M.D.   On: 10/06/2019 19:23    Procedures Procedures (including critical care time)  Medications Ordered in ED Medications  0.9 %  sodium chloride infusion ( Intravenous New Bag/Given 10/06/19 2038)  cefTRIAXone (ROCEPHIN) 1 g in sodium chloride 0.9 % 100 mL IVPB (has no administration in time range)  azithromycin (ZITHROMAX) 500 mg in sodium chloride 0.9 % 250 mL IVPB (has no administration in time range)  sodium chloride 0.9 % bolus 500 mL (0 mLs Intravenous Stopped 10/06/19 1954)    ED Course  I have reviewed the triage vital signs and the nursing notes.  Pertinent labs & imaging results that were available during my care of the patient were reviewed by me and considered in my medical decision making (see chart for details).    MDM Rules/Calculators/A&P                          Patient's EKG done had tons of artifact.  On on heart monitoring it appears that patient was in a sinus rhythm although the monitors that atrial for.  Seem to be P waves in front of each QRS complex and there was some PVCs.  Chest x-ray raise concerns about right-sided infiltrate or atelectasis or a small pleural effusion.  Patient's white blood cell count was 14,000.  Based on this there was some concern for infection.  Blood cultures were sent lactic acid ordered and urine sent for culture.  Urine is not back yet.  Urinary tract infection is also highly likely.  Patient after IV fluids she received 500 cc bolus she became more alert and would answer questions.  CT head without any acute findings.  BUN and creatinine BUN was elevated some but creatinine was normal.  Surprisingly no signs of acute  kidney injury or significant dehydration but patient seems to be improving with IV fluids.  Discussed with the [facility]ad hospitalist she is normally followed by [facility] primary  care they will see her for admission.  Based on the concerns for either pneumonia or urinary tract infection patient was started on Rocephin and Zithromax IV.  In addition Covid test is pending.  Patient has not had any the vaccines.    Final Clinical Impression(s) / ED Diagnoses Final diagnoses:  Altered mental status, unspecified altered mental status type  Dehydration    Rx / DC Orders ED Discharge Orders    None       Fredia Sorrow, MD 10/06/19 2103

## 2019-10-06 NOTE — H&P (Signed)
History and Physical    Autumn Wells NFA:[id number] DOB: 04-27-1927 DOA: 10/06/2019  PCP: Maury Dus, MD  Patient coming from: Home.  Chief Complaint: Increasing lethargy weakness confusion.  History obtained from patient's daughter.  HPI: Autumn Wells is a 86 y.o. female with history of hypertension, anemia, possible dementia, blindness and decreased hearing was brought to the ER after patient's daughter found that patient was getting increasingly lethargic with poor functional status unable to get out of bed with difficulty control bowel over the last 1 to 2 weeks.  Over the last 2 to 3 months patient has been declining with decreased mobility.  Patient's daughter states that patient usually ambulates with help of a walker which also patient has been using less over the last 2 months.  Patient also was found to be significantly confused.  No nausea vomiting but poor intake.  Patient stool is not well formed at times dark pedigree noticed yesterday.  ED Course: In the ER patient was afebrile her lab work showed leukocytosis and anemia.  WBC count was 14.5 hemoglobin 9.  When compared to the labs in Care Everywhere hemoglobin is to be around 11.  CT head and MRI brain were unremarkable.  Patient's UA shows concerning features for UTI chest x-ray shows possible infiltrates.  Patient on exam states he is very sleepy but follows commands moves all extremities.  Start on empiric antibiotics and admitted for further management of acute encephalopathy/lethargy.  Review of Systems: As per HPI, rest all negative.   Past Medical History:  Diagnosis Date  . Dementia ([place])   . Hypertension     Past Surgical History:  Procedure Laterality Date  . ANKLE SURGERY       reports that she has quit smoking. She has never used smokeless tobacco. She reports previous alcohol use. She reports that she does not use drugs.  No Known Allergies  Family History  Problem Relation Age of Onset  . Dementia  Maternal Aunt     Prior to Admission medications   Medication Sig Start Date End Date Taking? Authorizing Provider  acetaminophen (TYLENOL) 500 MG tablet Take 500 mg by mouth daily as needed for mild pain (aches.).   Yes [provider]  alendronate (FOSAMAX) 70 MG tablet Take 70 mg by mouth once a week. 06/06/19  Yes [provider]  Brinzolamide-Brimonidine (SIMBRINZA) 1-0.2 % SUSP Apply 1 drop to eye in the morning, at noon, and at bedtime. Right eye.   Yes [provider]  DULoxetine (CYMBALTA) 30 MG capsule Take 30 mg by mouth daily. 10/01/19  Yes [provider]  lisinopril (ZESTRIL) 20 MG tablet Take 20 mg by mouth every morning. 09/02/19  Yes [provider]  Multiple Vitamin (MULTIVITAMIN) tablet Take 1 tablet by mouth daily.   Yes [provider]  Netarsudil-Latanoprost (ROCKLATAN) 0.02-0.005 % SOLN Apply 1 drop to eye at bedtime. Right eye.   Yes [provider]  risperiDONE (RISPERDAL) 1 MG tablet Take 1-3 mg by mouth 2 (two) times daily. 1mg  am  3mg  hs. 10/05/19  Yes [provider]  atorvastatin (LIPITOR) 20 MG tablet Take 20 mg by mouth once a week. Patient not taking: Reported on 10/06/2019 06/01/19   [provider]  azaTHIOprine (IMURAN) 50 MG tablet Take 50 mg by mouth daily. Patient not taking: Reported on 10/06/2019 04/21/19   [provider]    Physical Exam: Constitutional: Moderately built and nourished. Vitals:   10/06/19 1759 10/06/19 1814 10/06/19 2048  10/06/19 2221  BP: 106/65 (!) 110/49  (!) 109/43  Pulse: 73 69  70  Resp: 17 15  11   Temp:   98 F (36.7 C)   SpO2: 95% 99%  97%   Eyes: Anicteric no pallor. ENMT: No discharge from the ears eyes nose or mouth. Neck: No mass felt.  No neck rigidity. Respiratory: No rhonchi or crepitations. Cardiovascular: S1-S2 heard. Abdomen: Soft nontender bowel sound present. Musculoskeletal: No edema. Skin: No rash. Neurologic: Lethargic  follows commands moves all extremities.  Oriented to name. Psychiatric: Lethargic oriented to name.   Labs on Admission: I have personally reviewed following labs and imaging studies  CBC: Recent Labs  Lab 10/06/19 1831  WBC 14.5*  NEUTROABS 12.7*  HGB 9.0*  HCT 30.3*  MCV 77.3*  PLT 767   Basic Metabolic Panel: Recent Labs  Lab 10/06/19 1831  NA 141  K 3.8  CL 108  CO2 24  GLUCOSE 120*  BUN 28*  CREATININE 0.77  CALCIUM 8.9   GFR: CrCl cannot be calculated (Unknown ideal weight.). Liver Function Tests: Recent Labs  Lab 10/06/19 1831  AST 23  ALT 13  ALKPHOS 67  BILITOT 0.6  PROT 5.8*  ALBUMIN 2.5*   Recent Labs  Lab 10/06/19 1831  LIPASE 18   No results for input(s): AMMONIA in the last 168 hours. Coagulation Profile: No results for input(s): INR, PROTIME in the last 168 hours. Cardiac Enzymes: No results for input(s): CKTOTAL, CKMB, CKMBINDEX, TROPONINI in the last 168 hours. BNP (last 3 results) No results for input(s): PROBNP in the last 8760 hours. HbA1C: No results for input(s): HGBA1C in the last 72 hours. CBG: Recent Labs  Lab 10/06/19 1839  GLUCAP 122*   Lipid Profile: No results for input(s): CHOL, HDL, LDLCALC, TRIG, CHOLHDL, LDLDIRECT in the last 72 hours. Thyroid Function Tests: No results for input(s): TSH, T4TOTAL, FREET4, T3FREE, THYROIDAB in the last 72 hours. Anemia Panel: No results for input(s): VITAMINB12, FOLATE, FERRITIN, TIBC, IRON, RETICCTPCT in the last 72 hours. Urine analysis:    Component Value Date/Time   COLORURINE AMBER (A) 10/06/2019 2056   APPEARANCEUR HAZY (A) 10/06/2019 2056   LABSPEC 1.023 10/06/2019 2056   PHURINE 5.0 10/06/2019 2056   GLUCOSEU NEGATIVE 10/06/2019 2056   HGBUR NEGATIVE 10/06/2019 2056   BILIRUBINUR NEGATIVE 10/06/2019 2056   [place] NEGATIVE 10/06/2019 2056   PROTEINUR NEGATIVE 10/06/2019 2056   NITRITE NEGATIVE 10/06/2019 2056   LEUKOCYTESUR TRACE (A) 10/06/2019 2056   Sepsis  Labs: @LABRCNTIP (procalcitonin:4,lacticidven:4) ) Recent Results (from the past 240 hour(s))  SARS Coronavirus 2 by RT PCR (hospital order, performed in [place] hospital lab) Nasopharyngeal Nasopharyngeal Swab     Status: None   Collection Time: 10/06/19  9:25 PM   Specimen: Nasopharyngeal Swab  Result Value Ref Range Status   SARS Coronavirus 2 NEGATIVE NEGATIVE Final    Comment: (NOTE) SARS-CoV-2 target nucleic acids are NOT DETECTED.  The SARS-CoV-2 RNA is generally detectable in upper and lower respiratory specimens during the acute phase of infection. The lowest concentration of SARS-CoV-2 viral copies this assay can detect is 250 copies / mL. A negative result does not preclude SARS-CoV-2 infection and should not be used as the sole basis for treatment or other patient management decisions.  A negative result may occur with improper specimen collection / handling, submission of specimen other than nasopharyngeal swab, presence of viral mutation(s) within the areas targeted by this assay, and inadequate number of viral copies (<250 copies /  mL). A negative result must be combined with clinical observations, patient history, and epidemiological information.  Fact Sheet for Patients:   StrictlyIdeas.no  Fact Sheet for Healthcare Providers: BankingDealers.co.za  This test is not yet approved or  cleared by the [place] FDA and has been authorized for detection and/or diagnosis of SARS-CoV-2 by FDA under an Emergency Use Authorization (EUA).  This EUA will remain in effect (meaning this test can be used) for the duration of the COVID-19 declaration under Section 564(b)(1) of the Act, 21 U.S.C. section 360bbb-3(b)(1), unless the authorization is terminated or revoked sooner.  Performed at [facility], [place] [address] [place], [place] [address]      Radiological Exams on Admission: CT Head Wo Contrast  Result  Date: 10/06/2019 CLINICAL DATA:  Altered mental status EXAM: CT HEAD WITHOUT CONTRAST TECHNIQUE: Contiguous axial images were obtained from the base of the skull through the vertex without intravenous contrast. COMPARISON:  MRI, CT 06/15/2003 FINDINGS: Brain: There is an indeterminate rounded 5 mm hyperdense focus along the lateral left pons. Sequela of remote left pontine lacunar infarct is noted in a similar distribution to comparison MRI. Suspect remote bilateral basal ganglia infarcts. No evidence of acute infarction, hemorrhage, hydrocephalus, extra-axial collection or midline shift. Symmetric prominence of the ventricles, cisterns and sulci compatible with parenchymal volume loss. Patchy areas of white matter hypoattenuation are most compatible with chronic microvascular angiopathy. Vascular: Atherosclerotic calcification of the carotid siphons. No hyperdense vessel. Skull: No calvarial fracture or suspicious osseous lesion. No scalp swelling or hematoma. Sinuses/Orbits: Shrunken hyperdense appearance of the left globe is new since 2005. 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 No acute abnormality of the included orbits. Atherosclerotic calcification of the carotid siphons. No hyperdense vessel. Other: None IMPRESSION: 1. Indeterminate rounded 5 mm hyperdense focus along the lateral left pons. While this may represent a cavernous malformation, dolichoectasia of a crossing vessel or other indeterminate lesion, incompletely assessed given the amount of streak at the level of the skull base. Could be further visualized with MRI with contrast if patient is able to tolerate. 2. 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 3. Chronic microvascular angiopathy and parenchymal volume loss. 4. Shrunken hyperdense appearance of the left globe is new since 2005. 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 Electronically Signed   By: Lovena Le M.D.    On: 10/06/2019 20:14   DG Chest Port 1 View  Result Date: 10/06/2019 CLINICAL DATA:  Near syncope. EXAM: PORTABLE CHEST 1 VIEW COMPARISON:  None FINDINGS: The lungs are hyperinflated. Mild to moderate severity atelectasis and/or infiltrate is seen within the right lung base. There is a small right pleural effusion. No pneumothorax is identified. The heart size and mediastinal contours are within normal limits. There is marked severity calcification of the aortic arch. Multilevel degenerative changes seen throughout the thoracic spine. IMPRESSION: 1. Mild to moderate severity right basilar atelectasis and/or infiltrate. 2. Small right pleural effusion. Electronically Signed   By: Virgina Norfolk M.D.   On: 10/06/2019 19:23    EKG: Independently reviewed.  Poor quality EKG.  Assessment/Plan Principal Problem:   Acute encephalopathy Active Problems:   Hypertension   Anemia   Acute lower UTI   CAP (community acquired pneumonia)    1. Acute encephalopathy/lethargy could be from possible infection source which could be UTI or possible pneumonia for which patient is on antibiotics.  Could also be progression of possible dementia.  Will  check ABG, ammonia level TSH B12 levels. 2. Anemia for this further worsening for which we will follow CBC since patient's daughter noticed some dark stools.  Check stool for occult blood.  Check anemia panel. 3. Hypertension presently patient has been kept n.p.o. we will keep patient on as needed IV hydralazine. 4. History of depression presently n.p.o.  Since patient has significant change in mental status will need more than 2 midnight stay in inpatient status.  Until patient becomes more alert and awake we will keep patient n.p.o. and IV fluids.  Will get swallow evaluation.   DVT prophylaxis: SCDs for now until we make sure that patient is not having significant loss in hemoglobin will avoid anticoagulation. Code Status: DNR confirmed with patient's  daughter. Family Communication: Patient's daughter. Disposition Plan: To be determined. Consults called: Palliative care.  Speech therapy. Admission status: Inpatient status.   Rise Patience MD [facility] Pager [phone].  If 7PM-7AM, please contact night-coverage [email or web address] Password [email or web address]  10/06/2019, 11:45 PM

## 2019-10-06 NOTE — ED Triage Notes (Signed)
Per EMS Pt lives with family anf they reported pt has declined over the last weeks.

## 2019-10-06 NOTE — ED Notes (Signed)
Patient transported to CT 

## 2019-10-06 NOTE — ED Notes (Signed)
The pt lies with both her eyes closed  The daughter at  The bedside reports that the pt is blind and hard of hearing  She just returned from [place] placed under her head

## 2019-10-07 ENCOUNTER — Inpatient Hospital Stay (HOSPITAL_COMMUNITY): Payer: Medicare Other

## 2019-10-07 DIAGNOSIS — Z7189 Other specified counseling: Secondary | ICD-10-CM

## 2019-10-07 DIAGNOSIS — Z515 Encounter for palliative care: Secondary | ICD-10-CM

## 2019-10-07 DIAGNOSIS — R627 Adult failure to thrive: Secondary | ICD-10-CM

## 2019-10-07 DIAGNOSIS — T17800A Unspecified foreign body in other parts of respiratory tract causing asphyxiation, initial encounter: Secondary | ICD-10-CM | POA: Insufficient documentation

## 2019-10-07 LAB — I-STAT ARTERIAL BLOOD GAS, ED
Acid-Base Excess: 0 mmol/L (ref 0.0–2.0)
Bicarbonate: 25.2 mmol/L (ref 20.0–28.0)
Calcium, Ion: 1.3 mmol/L (ref 1.15–1.40)
HCT: 26 % — ABNORMAL LOW (ref 36.0–46.0)
Hemoglobin: 8.8 g/dL — ABNORMAL LOW (ref 12.0–15.0)
O2 Saturation: 93 %
Potassium: 3 mmol/L — ABNORMAL LOW (ref 3.5–5.1)
Sodium: 146 mmol/L — ABNORMAL HIGH (ref 135–145)
TCO2: 27 mmol/L (ref 22–32)
pCO2 arterial: 42.2 mmHg (ref 32.0–48.0)
pH, Arterial: 7.384 (ref 7.350–7.450)
pO2, Arterial: 69 mmHg — ABNORMAL LOW (ref 83.0–108.0)

## 2019-10-07 LAB — AMMONIA: Ammonia: 10 umol/L (ref 9–35)

## 2019-10-07 LAB — BASIC METABOLIC PANEL
Anion gap: 9 (ref 5–15)
BUN: 23 mg/dL (ref 8–23)
CO2: 24 mmol/L (ref 22–32)
Calcium: 8.8 mg/dL — ABNORMAL LOW (ref 8.9–10.3)
Chloride: 110 mmol/L (ref 98–111)
Creatinine, Ser: 0.64 mg/dL (ref 0.44–1.00)
GFR calc Af Amer: >60 mL/min (ref >60)
GFR calc non Af Amer: >60 mL/min (ref >60)
Glucose, Bld: 104 mg/dL — ABNORMAL HIGH (ref 70–99)
Potassium: 3 mmol/L — ABNORMAL LOW (ref 3.5–5.1)
Sodium: 143 mmol/L (ref 135–145)

## 2019-10-07 LAB — CBC
HCT: 29.4 % — ABNORMAL LOW (ref 36.0–46.0)
HCT: 29.2 % — ABNORMAL LOW (ref 36.0–46.0)
Hemoglobin: 8.7 g/dL — ABNORMAL LOW (ref 12.0–15.0)
Hemoglobin: 8.8 g/dL — ABNORMAL LOW (ref 12.0–15.0)
MCH: 23.1 pg — ABNORMAL LOW (ref 26.0–34.0)
MCH: 23.8 pg — ABNORMAL LOW (ref 26.0–34.0)
MCHC: 29.6 g/dL — ABNORMAL LOW (ref 30.0–36.0)
MCHC: 30.1 g/dL (ref 30.0–36.0)
MCV: 78 fL — ABNORMAL LOW (ref 80.0–100.0)
MCV: 79.1 fL — ABNORMAL LOW (ref 80.0–100.0)
Platelets: 356 10*3/uL (ref 150–400)
Platelets: 330 10*3/uL (ref 150–400)
RBC: 3.77 MIL/uL — ABNORMAL LOW (ref 3.87–5.11)
RBC: 3.69 MIL/uL — ABNORMAL LOW (ref 3.87–5.11)
RDW: 17.2 % — ABNORMAL HIGH (ref 11.5–15.5)
RDW: 17.2 % — ABNORMAL HIGH (ref 11.5–15.5)
WBC: 13.1 10*3/uL — ABNORMAL HIGH (ref 4.0–10.5)
WBC: 12.5 10*3/uL — ABNORMAL HIGH (ref 4.0–10.5)
nRBC: 0 % (ref 0.0–0.2)
nRBC: 0 % (ref 0.0–0.2)

## 2019-10-07 LAB — TYPE AND SCREEN
ABO/RH(D): O POS
Antibody Screen: NEGATIVE

## 2019-10-07 LAB — RETICULOCYTES
Immature Retic Fract: 7.7 % (ref 2.3–15.9)
RBC.: 3.74 MIL/uL — ABNORMAL LOW (ref 3.87–5.11)
Retic Count, Absolute: 46.4 10*3/uL (ref 19.0–186.0)
Retic Ct Pct: 1.2 % (ref 0.4–3.1)

## 2019-10-07 LAB — FERRITIN: Ferritin: 32 ng/mL (ref 11–307)

## 2019-10-07 LAB — IRON AND TIBC
Iron: 11 ug/dL — ABNORMAL LOW (ref 28–170)
Saturation Ratios: 3 % — ABNORMAL LOW (ref 10.4–31.8)
TIBC: 330 ug/dL (ref 250–450)
UIBC: 319 ug/dL

## 2019-10-07 LAB — ABO/RH: ABO/RH(D): O POS

## 2019-10-07 LAB — FOLATE: Folate: 33.1 ng/mL (ref >5.9)

## 2019-10-07 LAB — TSH: TSH: 1.814 u[IU]/mL (ref 0.350–4.500)

## 2019-10-07 LAB — VITAMIN B12: Vitamin B-12: 422 pg/mL (ref 180–914)

## 2019-10-07 MED ORDER — POTASSIUM CHLORIDE 10 MEQ/100ML IV SOLN
10.0000 meq | INTRAVENOUS | Status: AC
  Administered 2019-10-07 (×3): 10 meq via INTRAVENOUS
  Filled 2019-10-07 (×3): qty 100

## 2019-10-07 MED ORDER — SODIUM CHLORIDE 0.9 % IV SOLN
510.0000 mg | Freq: Once | INTRAVENOUS | Status: AC
  Administered 2019-10-07: 510 mg via INTRAVENOUS
  Filled 2019-10-07: qty 17

## 2019-10-07 NOTE — ED Notes (Signed)
Secretary was requested to call [facility] for pt transport to [facility]- 1431.

## 2019-10-07 NOTE — Progress Notes (Signed)
PROGRESS NOTE    Chrishawna Farina  XKP:[id number] DOB: 1927-10-28 DOA: 10/06/2019 PCP: Maury Dus, MD  Brief Narrative:   86 year old female Hypertension Depression Known dementia Possible rheumatological illness on Imuran? Corneal ulcer followed at [facility] status post surgery Brought to [facility] emergency room 7/7 secondary to mental decline-declining food or drink and sleeping all the time Arousable but nonverbal on arrival.  Work-up showed white count 14 hemoglobin down from 11-9 ?  UTI?  Pneumonia  Assessment & Plan:   Principal Problem:   Acute encephalopathy Active Problems:   Hypertension   Anemia   Acute lower UTI   CAP (community acquired pneumonia)   1. Toxic metabolic encephalopathy secondary to possible infection?  UTI versus aspiration pneumonia given dementia a. Patient is alert coherent and is not able to tell me her location or city but is able to tell me her name b. She is not really complaining of abdominal pain so I quite frankly think on x-ray she has probable aspiration c. I have asked the nurse to downgrade her diet to dysphagia 1 diet and speech therapy should see her d. Continue for now ceftriaxone azithromycin e. Given the fact that she is edentulous partially blind and has hard hearing I am not sure if we will be able to prevent aspiration-she is also frail and probably has a poor swallow as she is 89 f. I will talk to her family about goals of care later today see below 2. Normocytic anemia with reported dark stools + severe iron deficiency a. Dark stools were reported by family b. Her hemoglobin is low and she has severe iron deficiency as evidenced by her iron studies c. We will give one-time dose of Feraheme d. I would not be any more aggressive than this one-time dose e. Repeat CBC plus differential a.m. 3. Hypokalemia a. Giving 3 runs of potassium as she may not be able to reliably take p.o. b. Might change fluids tomorrow if persisting to  contain potassium c. Check a.m. magnesium 4. AKI probably secondary to prerenal azotemia from ACE HTN a. In a 86 year old would probably hold aggressive control of blood pressure b. We will scale back lisinopril completely and only use as needed hydralazine p.o. c. Repeat labs a.m. 5. [place] syndrome witbh resultant blindness previously followed at [facility] a. Continue eyedrops 6. Seborrheic keratosis versus melanoma a. Would not really work-up-await palliative care input prior to further decisions 7. Depression a. Continue Cymbalta 30, Risperdal 1 mg a.m. 3 at bedtime if felt necessary-some evidence to support increased mortality while using these meds in the elderly 8. Advancing dementia likely stage IV-V a. See below  DVT prophylaxis: lovenox Code Status:  Family Communication: Spoke with daughter-she is a Network engineer on [employer] [address] at [employer] tells me that patient has declined over the past 2 to 3 months and has not been able to go to her ophthalmology appointments because they have not been able to get her out of bed When I ask her daughter Manuela Schwartz about goals of care she is thinking that palliative care input would be appropriate when I discussed risk of aspiration in the elderly and people with dementia-she also was not sure if she can take mom home as she cannot lift her She is aware patient will be transferring to [facility] long hospital and I will update MD over there Await speech and therapy eval if expect she will be here for several days Disposition:   Status is: Inpatient  Remains  inpatient appropriate because:Ongoing active pain requiring inpatient pain management and Unsafe d/c plan   Dispo: The patient is from: Home              Anticipated d/c is to: SNF              Anticipated d/c date is: 2 days              Patient currently is medically stable to d/c.   Consultants:   no  Procedures: cxr  Antimicrobials: azithromycin ceftiriaxone   Subjective: Awake  but cannot giver ROS  Objective: Vitals:   10/07/19 0345 10/07/19 0359 10/07/19 0400 10/07/19 0415  BP: 132/71  (!) 141/71 136/76  Pulse:  64 78 68  Resp:  14 16 16   Temp:      SpO2:  96% (!) 80% 93%    Intake/Output Summary (Last 24 hours) at 10/07/2019 0730 Last data filed at 10/06/2019 2225 Gross per 24 hour  Intake 600 ml  Output --  Net 600 ml   There were no vitals filed for this visit.  Examination:  General exam: Awake edentulous matted left eye without injection right eye has light reflex only she cannot tell how many fingers I am holding up Respiratory system: I do not appreciate rales or rhonchi posterolaterally Cardiovascular system: S1-S2 sinus arrhythmia Gastrointestinal system: Abdomen soft no rebound no tenderness she is wearing a depends She has a slight scratch in her natal cleft I am able to examine her chest more easily when she is turned and I do not hear any lung sounds consistent with pneumonia. Central nervous system: Neurologically able to follow commands lift arms above head and raise legs but is weak 5/5 power reflexes deferred Extremities: Soft Skin: Frail skin she also has on her right upper back large plaques consistent with either melanoma or seborrheic keratosis Psychiatry: Euthymic pleasant but confused  Data Reviewed: I have personally reviewed following labs and imaging studies Potassium 3.0 BUN/creatinine down from 28/0.7-->23/0.6 Ammonia 10.0 Iron 11, saturation ratio 3  Radiology Studies: CT Head Wo Contrast  Result Date: 10/06/2019 CLINICAL DATA:  Altered mental status EXAM: CT HEAD WITHOUT CONTRAST TECHNIQUE: Contiguous axial images were obtained from the base of the skull through the vertex without intravenous contrast. COMPARISON:  MRI, CT 06/15/2003 FINDINGS: Brain: There is an indeterminate rounded 5 mm hyperdense focus along the lateral left pons. Sequela of remote left pontine lacunar infarct is noted in a similar distribution to  comparison MRI. Suspect remote bilateral basal ganglia infarcts. No evidence of acute infarction, hemorrhage, hydrocephalus, extra-axial collection or midline shift. Symmetric prominence of the ventricles, cisterns and sulci compatible with parenchymal volume loss. Patchy areas of white matter hypoattenuation are most compatible with chronic microvascular angiopathy. Vascular: Atherosclerotic calcification of the carotid siphons. No hyperdense vessel. Skull: No calvarial fracture or suspicious osseous lesion. No scalp swelling or hematoma. Sinuses/Orbits: Shrunken hyperdense appearance of the left globe is new since 2005. 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 No acute abnormality of the included orbits. Atherosclerotic calcification of the carotid siphons. No hyperdense vessel. Other: None IMPRESSION: 1. Indeterminate rounded 5 mm hyperdense focus along the lateral left pons. While this may represent a cavernous malformation, dolichoectasia of a crossing vessel or other indeterminate lesion, incompletely assessed given the amount of streak at the level of the skull base. Could be further visualized with MRI with contrast if patient is able to tolerate. 2. 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 3. Chronic microvascular angiopathy and parenchymal volume loss. 4. Shrunken hyperdense appearance of the left globe is new since 2005. 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 Electronically Signed   By: Lovena Le M.D.   On: 10/06/2019 20:14   MR BRAIN WO CONTRAST  Result Date: 10/07/2019 CLINICAL DATA:  Encephalopathy EXAM: MRI HEAD WITHOUT CONTRAST TECHNIQUE: Multiplanar, multiecho pulse sequences of the brain and surrounding structures were obtained without intravenous contrast. COMPARISON:  None. FINDINGS: Brain: No acute infarct, acute hemorrhage or extra-axial collection. Early confluent hyperintense T2-weighted signal of the  periventricular and deep white matter, most commonly due to chronic ischemic microangiopathy. There is generalized atrophy without lobar predilection. Single focus of chronic microhemorrhage in the left cerebellum. A partially empty sella is incidentally noted. Vascular: Normal flow voids. Skull and upper cervical spine: Normal marrow signal. Sinuses/Orbits: Negative. Other: None IMPRESSION: 1. No acute intracranial abnormality. 2. Generalized atrophy and findings of chronic ischemic microangiopathy. Electronically Signed   By: Ulyses Jarred M.D.   On: 10/07/2019 03:24   DG Chest Port 1 View  Result Date: 10/06/2019 CLINICAL DATA:  Near syncope. EXAM: PORTABLE CHEST 1 VIEW COMPARISON:  None FINDINGS: The lungs are hyperinflated. Mild to moderate severity atelectasis and/or infiltrate is seen within the right lung base. There is a small right pleural effusion. No pneumothorax is identified. The heart size and mediastinal contours are within normal limits. There is marked severity calcification of the aortic arch. Multilevel degenerative changes seen throughout the thoracic spine. IMPRESSION: 1. Mild to moderate severity right basilar atelectasis and/or infiltrate. 2. Small right pleural effusion. Electronically Signed   By: Virgina Norfolk M.D.   On: 10/06/2019 19:23     Scheduled Meds: . Netarsudil-Latanoprost  1 drop Ophthalmic QHS   Continuous Infusions: . azithromycin    . cefTRIAXone (ROCEPHIN)  IV    . dextrose 5 % and 0.9% NaCl 75 mL/hr at 10/07/19 0035     LOS: 1 day    Time spent: 22 minutes  Nita Sells, MD [facility] To contact the attending provider between 7A-7P or the covering provider during after hours 7P-7A, please log into the web site [email or web address] and access using universal [place] password for that web site. If you do not have the password, please call the hospital operator.  10/07/2019, 7:30 AM

## 2019-10-07 NOTE — ED Notes (Signed)
Hospice NP at bedside

## 2019-10-07 NOTE — ED Notes (Signed)
Pure wick and bed pad was changed, pt repositioned for comfort, RT at bedside to draw ABG, pt assessed to be alert to self, VSS.

## 2019-10-07 NOTE — ED Notes (Signed)
To nri

## 2019-10-07 NOTE — ED Notes (Signed)
Pt returned from mri

## 2019-10-07 NOTE — Consult Note (Signed)
Consultation Note Date: 10/07/2019   Patient Name: Autumn Wells  DOB: January 30, 1928  MRN: [id number]  Age / Sex: 86 y.o., female  PCP: Maury Dus, MD Referring Physician: Kathie Dike, MD  Reason for Consultation: Establishing goals of care  HPI/Patient Profile: 86 y.o. female  with past medical history of hypertension, anemia, possible dementia, blindness, and decreased hearing admitted on 10/06/2019 after presenting to the ED with complaint of increased lethargy, weakness, and confusion over the last few weeks per family. Family reports she has been unable to get OOB for the last 1-2 weeks and also reports general decline over the last 2-3 months. In the ED, CT head and MRI brain were negative and hemoglobin 9 (baseline around 11). UA is concerning for possible UTI and chest x-ray shows possible infiltrates. Patient has been started on empiric antibiotics and admitted to the Hospitalist service for further management of acute encephalopathy.   Clinical Assessment and Goals of Care: I have reviewed medical records including EPIC notes, labs and imaging, and examined the patient. She is oriented to self, denies any complaints at present, states "I feel ok". I called daughter Manuela Schwartz to discuss diagnosis, prognosis, GOC, EOL wishes, disposition, and options.  I introduced Palliative Medicine as specialized medical care for people living with serious illness. It focuses on providing relief from the symptoms and stress of a serious illness. 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   We discussed a brief life review of the patient. She has been widowed since 54. She has 1 daughter Manuela Schwartz, and 1 son who passed away 30 years ago. She has 4 grandchildren, who she helped care for when they were little.  As far as functional and nutritional status, Manuela Schwartz reports  increasing difficulty at home. She reports increasing issues with weight bearing, transferring, and ambulating for months. She also reports decreased po intake over the past month and now concern for aspiration. She expresses concern that he can no longer care for her mother at home.   We discussed her current illness and what it means in the larger context of her ongoing co-morbidities.  Natural disease trajectory of failure to thrive was discussed. Discussed that aspiration pneumonia often becomes recurrent.   Advanced directives, concepts specific to code status, artifical feeding and hydration, and rehospitalization were considered and discussed.   Hospice and Palliative Care services outpatient were explained and offered. Manuela Schwartz is very open to home with hospice, and is receptive to ongoing discussions about this.    Questions and concerns were addressed. Manuela Schwartz was encouraged to call with questions or concerns.   Primary decision maker: daughter Manuela Schwartz   SUMMARY OF RECOMMENDATIONS   - DNR/DNI (already in place) - daughter wants to continue current treatment for pneumonia and possible UTI - is open to home with hospice once she is medically stable for discharge, will need ongoing discussions around this  Code Status/Advance Care Planning:  DNR  Palliative Prophylaxis:   Aspiration,  Delirium Protocol, Oral Care and Turn Reposition  Prognosis:   < 6 months  Discharge Planning: To Be Determined      Primary Diagnoses: Present on Admission: . Acute encephalopathy . Hypertension . Anemia . Acute lower UTI . CAP (community acquired pneumonia) . Aspiration into lower respiratory tract   I have reviewed the medical record, interviewed the patient and family, and examined the patient. The following aspects are pertinent.  Past Medical History:  Diagnosis Date  . Dementia ([place])   . Hypertension     Family History  Problem Relation Age of Onset  . Dementia Maternal Aunt      Scheduled Meds: . Netarsudil-Latanoprost  1 drop Right Eye QHS   Continuous Infusions: . azithromycin    . cefTRIAXone (ROCEPHIN)  IV    . dextrose 5 % and 0.9% NaCl 75 mL/hr at 10/07/19 1022   PRN Meds:.hydrALAZINE, ondansetron **OR** ondansetron (ZOFRAN) IV Medications Prior to Admission:  Prior to Admission medications   Medication Sig Start Date End Date Taking? Authorizing Provider  acetaminophen (TYLENOL) 500 MG tablet Take 500 mg by mouth daily as needed for mild pain (aches.).   Yes [provider]  alendronate (FOSAMAX) 70 MG tablet Take 70 mg by mouth once a week. 06/06/19  Yes [provider]  Brinzolamide-Brimonidine (SIMBRINZA) 1-0.2 % SUSP Apply 1 drop to eye in the morning, at noon, and at bedtime. Right eye.   Yes [provider]  DULoxetine (CYMBALTA) 30 MG capsule Take 30 mg by mouth daily. 10/01/19  Yes [provider]  lisinopril (ZESTRIL) 20 MG tablet Take 20 mg by mouth every morning. 09/02/19  Yes [provider]  Multiple Vitamin (MULTIVITAMIN) tablet Take 1 tablet by mouth daily.   Yes [provider]  Netarsudil-Latanoprost (ROCKLATAN) 0.02-0.005 % SOLN Apply 1 drop to eye at bedtime. Right eye.   Yes [provider]  risperiDONE (RISPERDAL) 1 MG tablet Take 1-3 mg by mouth 2 (two) times daily. 1mg  am  3mg  hs. 10/05/19  Yes [provider]  atorvastatin (LIPITOR) 20 MG tablet Take 20 mg by mouth once a week. Patient not taking: Reported on 10/06/2019 06/01/19   [provider]  azaTHIOprine (IMURAN) 50 MG tablet Take 50 mg by mouth daily. Patient not taking: Reported on 10/06/2019 04/21/19   [provider]   No Known Allergies Review of Systems  Unable to perform ROS: Dementia    Physical Exam Vitals reviewed.  Constitutional:      Comments: Frail, chronically ill-appearing  Pulmonary:     Effort: Pulmonary effort is normal.  Neurological:     Comments: Oriented to self      Vital Signs: BP 131/63 (BP Location: Right Arm)   Pulse 78   Temp 98.3 F (36.8 C) (Oral)   Resp 16   SpO2 94%  Pain Scale: Faces   Pain Score: 0-No pain   SpO2: SpO2: 94 % O2 Device:SpO2: 94 % O2 Flow Rate: .   IO: Intake/output summary:   Intake/Output Summary (Last 24 hours) at 10/07/2019 1816 Last data filed at 10/06/2019 2225 Gross per 24 hour  Intake 600 ml  Output --  Net 600 ml           Palliative Assessment/Data: 20-30%    Time In: 12:00 Time Out: 12:50 Time Total: 50 minutes Greater than 50%  of this time was spent counseling and coordinating care related to the above assessment and plan.  Signed by: Lavena Bullion, NP  Please contact Palliative Medicine Team phone at [phone] for questions and concerns.  For individual provider: See Shea Evans

## 2019-10-07 NOTE — ED Notes (Signed)
[facility] is here, loading pt and belongings up on stretcher.

## 2019-10-08 DIAGNOSIS — D509 Iron deficiency anemia, unspecified: Secondary | ICD-10-CM

## 2019-10-08 DIAGNOSIS — R4182 Altered mental status, unspecified: Secondary | ICD-10-CM | POA: Insufficient documentation

## 2019-10-08 DIAGNOSIS — L8990 Pressure ulcer of unspecified site, unspecified stage: Secondary | ICD-10-CM | POA: Diagnosis present

## 2019-10-08 LAB — URINE CULTURE: Culture: NO GROWTH

## 2019-10-08 MED ORDER — SODIUM CHLORIDE 0.9 % IV SOLN
1.5000 g | Freq: Four times a day (QID) | INTRAVENOUS | Status: DC
  Administered 2019-10-08 – 2019-10-12 (×18): 1.5 g via INTRAVENOUS
  Filled 2019-10-08 (×4): qty 1.5
  Filled 2019-10-08: qty 4
  Filled 2019-10-08 (×5): qty 1.5
  Filled 2019-10-08: qty 4
  Filled 2019-10-08 (×9): qty 1.5

## 2019-10-08 NOTE — Progress Notes (Signed)
Daily Progress Note   Patient Name: Autumn Wells       Date: 10/08/2019 DOB: [birth year]  Age: 86 y.o. MRN#: [id number] Attending Physician: Dwyane Dee, MD Primary Care Physician: Maury Dus, MD Admit Date: 10/06/2019  Reason for Consultation/Follow-up: Disposition and Establishing goals of care  Subjective: Chart review was performed.   Went to visit patient at bedside - no family present. Patient was lying in bed awake. She was not able to tell me her name or where she was. She denied having any pain. No gestures or non-verbal signs of discomfort noted. Spoke with primary RN who stated patient ate 25% of her breakfast - patient needed assistance eating as she was not able to feed herself. Primary RN had no acute concerns.  Her daughter/Susan works on the [employer] - went to speak with her in person. Provided update on SLP assessment and how patient was doing this morning. Daughter had more questions about the different levels of hospice support as well as SNF rehab. Education was provided on these topics. Offered oupatient Palliative support with SNF rehab if that is where she decided for discharge. After discussion, Manuela Schwartz was interested in having hospice evaluate her mother for home hospice support.   Addressed all questions and concerns. Encouraged to call with questions and/or concerns. PMT card provided.  Length of Stay: 2  Current Medications: Scheduled Meds:   Netarsudil-Latanoprost  1 drop Right Eye QHS    Continuous Infusions:  ampicillin-sulbactam (UNASYN) IV 1.5 g (10/08/19 0958)    PRN Meds: hydrALAZINE, ondansetron **OR** ondansetron (ZOFRAN) IV  Physical Exam Constitutional:      General: She is not in acute distress. Pulmonary:     Effort: Pulmonary effort is normal. No  respiratory distress.  Skin:    General: Skin is warm and dry.  Neurological:     Mental Status: She is alert. She is disoriented.     Motor: Weakness present.  Psychiatric:        Cognition and Memory: Cognition is impaired.             Vital Signs: BP (!) 154/70 (BP Location: Left Arm)    Pulse 68    Temp 99.2 F (37.3 C) (Oral)    Resp 16    Ht 5\' 5"  (1.651 m)    Wt 57.2 kg    SpO2 96%    BMI 20.98 kg/m  SpO2: SpO2: 96 % O2 Device: O2 Device: Room Air O2 Flow Rate:    Intake/output summary:   Intake/Output Summary (Last 24 hours) at 10/08/2019 1355 Last data filed at 10/08/2019 1100 Gross per 24 hour  Intake 1032 ml  Output 100 ml  Net 932 ml   LBM: Last BM Date: 10/07/19 Baseline Weight: Weight: 57.2 kg Most recent weight: Weight: 57.2 kg       Palliative Assessment/Data: PPS 30%    Flowsheet Rows     Most Recent Value  Intake Tab  Referral Department Hospitalist  Unit at Time of Referral ER  Palliative Care Primary Diagnosis Neurology  Date Notified 10/07/19  Palliative Care Type New Palliative care  Reason for referral Clarify Goals of Care  Date of Admission 10/06/19  Date first seen by Palliative Care 10/07/19  # of days Palliative referral response time 0 Day(s)  # of days IP prior to Palliative referral 1  Clinical Assessment  Psychosocial & Spiritual Assessment  Palliative Care Outcomes      Patient Active Problem List   Diagnosis Date Noted   Aspiration into lower respiratory tract 10/07/2019   Failure to thrive in adult    Palliative care by specialist    Goals of care, counseling/discussion    Acute encephalopathy 10/06/2019   Hypertension 10/06/2019   Anemia 10/06/2019   Acute lower UTI 10/06/2019   CAP (community acquired pneumonia) 10/06/2019    Palliative Care Assessment & Plan   HPI: 86 y.o. female  with past medical history of hypertension, anemia, possible dementia, blindness, and decreased hearing admitted on 10/06/2019  after presenting to the ED with complaint of increased lethargy, weakness, and confusion over the last few weeks per family. Family reports she has been unable to get OOB for the last 1-2 weeks and also reports general decline over the last 2-3 months. In the ED, CT head and MRI brain were negative and hemoglobin 9 (baseline around 11). UA is concerning for possible UTI and chest x-ray shows possible infiltrates. Patient has been started on empiric antibiotics and admitted to the Hospitalist service for further management of acute encephalopathy.   Assessment: Acute encephalopathy Dementia Lower UTI Aspiration pneumonia Failure to thrive   Recommendations/Plan:  Continue current medical treatment  Continue DNR/DNI as previously documented  Daughter would like patient to be evaluated for home hospice support - TOC and hospice liaison notified  Goals of Care and Additional Recommendations:  Limitations on Scope of Treatment: Full Scope Treatment  Code Status:  DNR  Prognosis:   < 6 months  Discharge Planning:  Home with Hospice  Care plan was discussed with primary RN, daughter/Susan, TOC, hospice liaison, Dr. Sabino Gasser  Thank you for allowing the Palliative Medicine Team to assist in the care of this patient.   Total Time 35 minutes Prolonged Time Billed  no       Greater than 50%  of this time was spent counseling and coordinating care related to the above assessment and plan.  Ketzaly Cardella M. Tamala Julian, [place] Palliative Medicine Team Team Phone # [phone]

## 2019-10-08 NOTE — Assessment & Plan Note (Signed)
-  Continue hydralazine as needed

## 2019-10-08 NOTE — Evaluation (Signed)
Clinical/Bedside Swallow Evaluation Patient Details  Name: Autumn Wells MRN: [id number] Date of Birth: 21-Jul-1927  Today's Date: 10/08/2019 Time: SLP Start Time (ACUTE ONLY): 1212 SLP Stop Time (ACUTE ONLY): 9509 SLP Time Calculation (min) (ACUTE ONLY): 24 min  Past Medical History:  Past Medical History:  Diagnosis Date  . Dementia ([place])   . Hypertension    Past Surgical History:  Past Surgical History:  Procedure Laterality Date  . ANKLE SURGERY     HPI:  86 y.o. female  with past medical history of hypertension, anemia, possible dementia, blindness, and decreased hearing admitted on 10/06/2019 after presenting to the ED with complaint of increased lethargy, weakness, and confusion over the last few weeks per family. Family reports she has been unable to get OOB for the last 1-2 weeks and also reports general decline over the last 2-3 months. In the ED, CT head and MRI brain were negative and hemoglobin 9 (baseline around 11). UA is concerning for possible UTI and chest x-ray shows possible infiltrates.. Per family intake has been poor, there is concern for aspiration.    Assessment / Plan / Recommendation Clinical Impression  At the time of assessment, pt easily awakened from sleep and responded to questions and commands intermittently, sometimes with a delayed response. Her vocal quality is clear. She agreed to sips of water, held onto the straw orraly and took appropriate sized sips with adeuqte timing for breathing, keeping straw in mouth for the next sip. No signs of aspiration. Puree tolerated well, pt also masticated a graham well, but did not like it. Would not limit pts diet texture to puree given presentation today, though suspect there may be instances of oral holding as mentation waxes and wanes. Suggest at least a dys 2 texture with thin liquids. Will f/u for tolerance given concern for aspiration.  SLP Visit Diagnosis: Dysphagia, oropharyngeal phase (R13.12)    Aspiration Risk   Mild aspiration risk    Diet Recommendation Thin liquid;Dysphagia 2 (Fine chop)   Liquid Administration via: Cup;Straw Medication Administration: Crushed with puree Postural Changes: Seated upright at 90 degrees    Other  Recommendations     Follow up Recommendations Skilled Nursing facility      Frequency and Duration min 2x/week  2 weeks       Prognosis Prognosis for Safe Diet Advancement: Good      Swallow Study   General HPI: 85 y.o. female  with past medical history of hypertension, anemia, possible dementia, blindness, and decreased hearing admitted on 10/06/2019 after presenting to the ED with complaint of increased lethargy, weakness, and confusion over the last few weeks per family. Family reports she has been unable to get OOB for the last 1-2 weeks and also reports general decline over the last 2-3 months. In the ED, CT head and MRI brain were negative and hemoglobin 9 (baseline around 11). UA is concerning for possible UTI and chest x-ray shows possible infiltrates.. Per family intake has been poor, there is concern for aspiration.  Type of Study: Bedside Swallow Evaluation Previous Swallow Assessment: none Diet Prior to this Study: Dysphagia 1 (puree);Thin liquids History of Recent Intubation: No Behavior/Cognition: Alert;Requires cueing Oral Cavity Assessment: Within Functional Limits Oral Care Completed by SLP: No Oral Cavity - Dentition: Missing dentition Vision: Functional for self-feeding Self-Feeding Abilities: Total assist Patient Positioning: Upright in bed Baseline Vocal Quality: Normal Volitional Cough: Cognitively unable to elicit Volitional Swallow: Unable to elicit    Oral/Motor/Sensory Function Overall Oral Motor/Sensory Function: Within  functional limits   Ice Chips Ice chips: Not tested   Thin Liquid Thin Liquid: Within functional limits Presentation: Straw    Nectar Thick Nectar Thick Liquid: Not tested   Honey Thick Honey Thick Liquid: Not  tested   Puree Puree: Within functional limits Presentation: Spoon   Solid     Solid: Within functional limits      Camrin Lapre, Katherene Ponto 10/08/2019,12:43 PM

## 2019-10-08 NOTE — Assessment & Plan Note (Signed)
-   continue wound care per nursing

## 2019-10-08 NOTE — Hospital Course (Addendum)
Ms. Brekke is a 86 yo CF with PMH dementia, HTN who has had decreasing functional status and progressive decline over the past 2-3 months prior to hospitalization. There was concern on her workup for possible PNA and/or UTI. She was started on abx and admitted for further treatment. Due to her extremely poor quality of life, palliative care was also consulted to help guide family discussions regarding goals of care.  She did not have significant improvement with treatment and after family discussion with PC, decision was made to transition patient to hospice. However, prior to this, patient's daughter expressed wishes for patient to attempt rehab to gain further strength before being discharged home. GOC were again discussed with PC but daughter still insisted on rehab at discharge.

## 2019-10-08 NOTE — Assessment & Plan Note (Signed)
-   unable to delieniate if symptomatic due to encephalopathy/dementia - s/p CTX/azithro; changed to Unasyn for now - continue abx

## 2019-10-08 NOTE — Progress Notes (Signed)
Per pharmacy the hospital does not have patients eye drops. Pt daughter Autumn Wells informed and RN requested family to bring eye drops. Daughter Autumn Wells stated she can bring eye drops 7/10.

## 2019-10-08 NOTE — Assessment & Plan Note (Signed)
-   likely multifactorial in setting of dementia, possible infection, and worsening functional status with malnutrition state

## 2019-10-08 NOTE — Evaluation (Signed)
Physical Therapy Evaluation Patient Details Name: Autumn Wells MRN: [id number] DOB: Aug 16, 1927 Today's Date: 10/08/2019   History of Present Illness  Pt is 86 yo female with PMH of HTN, depression, and dementia.  Pt with declining mobility over the past 2-3 months.  Pt admitted with metabolic encephalopathy secondary to possible UTI vs PNE.   Noted in chart that family is considering hospice vs pallative.  Case manager reports daughter concerned about level assist required at home.  Clinical Impression  Pt admitted with above diagnosis. Pt requiring mod-max A for transfers and standing with multimodal cues and increased time to respond.  Did note family may be interested in hospice but concerned about the level of physical assist the pt may need.  Pt could benefit from SNF to improve mobility as able in order to reduce caregiver burden. Pt currently with functional limitations due to the deficits listed below (see PT Problem List). Pt will benefit from skilled PT to increase their independence and safety with mobility to allow discharge to the venue listed below.       Follow Up Recommendations SNF    Equipment Recommendations  Wheelchair cushion (measurements PT);Wheelchair (measurements PT);Hospital bed    Recommendations for Other Services       Precautions / Restrictions Precautions Precautions: Fall      Mobility  Bed Mobility Overal bed mobility: Needs Assistance Bed Mobility: Supine to Sit;Sit to Supine     Supine to sit: Mod assist Sit to supine: Max assist   General bed mobility comments: increased time to respond ; max multimodal cues for sequencing  Transfers Overall transfer level: Needs assistance Equipment used: Rolling walker (2 wheeled) Transfers: Sit to/from Stand Sit to Stand: Max assist         General transfer comment: Max assist with assist for hand placement and use of bed pad to faciliate standing  Ambulation/Gait             General Gait  Details: unable  Stairs            Wheelchair Mobility    Modified Rankin (Stroke Patients Only)       Balance Overall balance assessment: Needs assistance Sitting-balance support: Bilateral upper extremity supported Sitting balance-Leahy Scale: Poor     Standing balance support: Bilateral upper extremity supported Standing balance-Leahy Scale: Zero Standing balance comment: required min-mod A for balance in standing                             Pertinent Vitals/Pain Pain Assessment: No/denies pain    Home Living Family/patient expects to be discharged to:: Skilled nursing facility Living Arrangements: Alone                    Prior Function           Comments: Pt unable to provide PLOF.  Per charts she has had declining mobility over the past 2-3 months with significant decline the past week.     Hand Dominance        Extremity/Trunk Assessment   Upper Extremity Assessment Upper Extremity Assessment: Generalized weakness;Difficult to assess due to impaired cognition (limited shoulder elevation)    Lower Extremity Assessment Lower Extremity Assessment: Generalized weakness;Difficult to assess due to impaired cognition    Cervical / Trunk Assessment Cervical / Trunk Assessment: Normal  Communication   Communication: HOH;Other (comment) (blind)  Cognition Arousal/Alertness: Awake/alert Behavior During Therapy: WFL for tasks assessed/performed Overall  Cognitive Status: History of cognitive impairments - at baseline                                        General Comments General comments (skin integrity, edema, etc.): VSS;    Exercises General Exercises - Lower Extremity Ankle Circles/Pumps: AROM;Both;15 reps Long Arc Quad: AROM;Both;15 reps   Assessment/Plan    PT Assessment Patient needs continued PT services  PT Problem List Decreased strength;Decreased mobility;Decreased safety awareness;Decreased range  of motion;Decreased coordination;Decreased activity tolerance;Decreased cognition;Decreased balance;Decreased knowledge of use of DME       PT Treatment Interventions DME instruction;Therapeutic activities;Gait training;Therapeutic exercise;Patient/family education;Functional mobility training;Balance training    PT Goals (Current goals can be found in the Care Plan section)  Acute Rehab PT Goals Patient Stated Goal: unable; family not present: per case manager palliative/hospice has been discussed but also SNF as daughter reports unable to lift pt at home PT Goal Formulation: Patient unable to participate in goal setting Time For Goal Achievement: 10/22/19 Potential to Achieve Goals: Fair    Frequency Min 2X/week   Barriers to discharge Decreased caregiver support      Co-evaluation               AM-PAC PT "6 Clicks" Mobility  Outcome Measure Help needed turning from your back to your side while in a flat bed without using bedrails?: A Lot Help needed moving from lying on your back to sitting on the side of a flat bed without using bedrails?: A Lot Help needed moving to and from a bed to a chair (including a wheelchair)?: Total Help needed standing up from a chair using your arms (e.g., wheelchair or bedside chair)?: Total Help needed to walk in hospital room?: Total Help needed climbing 3-5 steps with a railing? : Total 6 Click Score: 8    End of Session Equipment Utilized During Treatment: Gait belt Activity Tolerance: Patient limited by lethargy Patient left: in bed;with call bell/phone within reach;with bed alarm set (in chair position) Nurse Communication: Mobility status PT Visit Diagnosis: Unsteadiness on feet (R26.81);Muscle weakness (generalized) (M62.81)    Time: 1140-1210 PT Time Calculation (min) (ACUTE ONLY): 30 min   Charges:   PT Evaluation $PT Eval Low Complexity: 1 Low PT Treatments $Therapeutic Activity: 8-22 mins        Abran Richard, PT Acute  Rehab Services Pager [phone] [facility] [place] 10/08/2019, 12:26 PM

## 2019-10-08 NOTE — Care Management Important Message (Signed)
Important Message  Patient Details IM Letter given to [name] to present to the Patient Name: Autumn Wells MRN: [id number] Date of Birth: [birth year]   Medicare Important Message Given:  Yes     Kerin Salen 10/08/2019, 10:05 AM

## 2019-10-08 NOTE — Assessment & Plan Note (Addendum)
-  Iron stores low.  Hemoglobin at baseline -Hold off on oral iron for now given imminent discharge to hospice/comfort

## 2019-10-08 NOTE — Assessment & Plan Note (Signed)
-   concern was for aspiration on admission; had been on CTX/azithro; will change to Unasyn to cover anaerobes - RLL does appear to have infiltrate however not likely that this is sole culprit for her debilitated state and decline - continue unasyn, will complete course of likely Augmentin at discharge

## 2019-10-08 NOTE — Progress Notes (Addendum)
Hydrologist [facility]) Hospital Liaison: RN note     Notified by Elmer Picker, NP with the palliative medical team of family interest in hospice at home.  Hospital liaison spoke with daughter, Sanjuana Letters to explain hospice services. Manuela Schwartz stated she is interested in the patient receiving rehab at a SNF. Manuela Schwartz was advised we could follow with our Palliative program and switch to hospice after the patient competes physical therapy if this is determined to be the discharge plan.   Notified Larina Bras, RN with PMT of our conversation.  [facility] with continue to follow patient while hospitalized to arrange for [facility] Palliative program to follow after discharge or set up hospice if plans change.  Please call with any hospice or palliative related questions.  Thank you,  Farrel Gordon, RN, [facility] (listed on [facility] under [place])   [phone]

## 2019-10-08 NOTE — Assessment & Plan Note (Addendum)
-   evaluated by [facility]; greatly appreciate assistance  -She is hospice appropriate with significant clinical decline over the past several months.  Daughter insistent on patient attempting rehab at discharge.  Low suspicion that she will benefit much from rehab, however will follow up referrals to rehab and discharge when bed available.  After rehab, plan appears to be discharging patient home to enroll with hospice.  She will be followed by palliative care at rehab as well.

## 2019-10-08 NOTE — Progress Notes (Signed)
PROGRESS NOTE    Autumn Wells   HER:[id number]  DOB: 1927/04/29  DOA: 10/06/2019     2  PCP: Maury Dus, MD  CC: weakness, dizziness, "declining"  Hospital Course: Autumn Wells is a 86 yo CF with PMH dementia, HTN who has had decreasing functional status and progressive decline over the past 2-3 months prior to hospitalization. There was concern on her workup for possible PNA and/or UTI. She was started on abx and admitted for further treatment. Due to her extremely poor quality of life, palliative care was also consulted to help guide family discussions regarding goals of care.  She did not have significant improvement with treatment and after family discussion with PC, decision was made to transition patient to hospice.    Interval History:  Patient transferred from [facility], ER.  She has had overall failure to thrive over the past few weeks to months.  She has been initiated on treatment for possible underlying UTI and pneumonia.  Family has had multiple discussions with palliative care and hospice. At this time, daughter is wishing for the patient to receive rehab at an SNF at discharge then transitioning to hospice.  When seen on rounds, patient was resting comfortably in bed.  She was oriented to her name only but did not know she was at [facility] nor able to answer any other questions.  Old records reviewed in assessment of this patient  ROS: Review of systems not obtained due to patient factors.  Cognitive impairment  Assessment & Plan: Acute encephalopathy - likely multifactorial in setting of dementia, possible infection, and worsening functional status with malnutrition state  Hypertension -Continue hydralazine as needed  Iron deficiency anemia, unspecified -Iron stores low.  Hemoglobin at baseline -Hold off on oral iron for now given imminent discharge to hospice/comfort  Acute lower UTI - unable to delieniate if symptomatic due to encephalopathy/dementia - s/p  CTX/azithro; changed to Unasyn for now - continue abx  CAP (community acquired pneumonia) - concern was for aspiration on admission; had been on CTX/azithro; will change to Unasyn to cover anaerobes - RLL does appear to have infiltrate however not likely that this is sole culprit for her debilitated state and decline - continue unasyn, will complete course of likely Augmentin at discharge  Failure to thrive in adult - evaluated by [facility]; greatly appreciate assistance   Pressure injury of skin - continue wound care per nursing    Antimicrobials: Rocephin 10/06/2019 >> 10/07/2019 Azithromycin 10/06/2019>> 10/07/2019 Unasyn 10/08/2019>> present  DVT prophylaxis: SCD Code Status: DNR Family Communication: Patient's daughter Disposition Plan:  . Patient came from: Home        . Barriers to d/c OR conditions which need to be met to effect a safe d/c: None . The current disposition plan is discharge to: SNF/rehab   Objective: Blood pressure (!) 154/70, pulse 68, temperature 99.2 F (37.3 C), temperature source Oral, resp. rate 16, height _0  (1.651 m), weight 57.2 kg, SpO2 96 %.   Intake/Output Summary (Last 24 hours) at 10/08/2019 1614 Last data filed at 10/08/2019 1100 Gross per 24 hour  Intake 1032 ml  Output 100 ml  Net 932 ml   Last Weight  Most recent update: 10/08/2019  1:02 PM   Weight  57.2 kg (126 lb 1.7 oz)           Examination: General appearance: Chronically ill-appearing elderly woman resting in bed in no distress.  Does follow some commands but is oriented only to name  Head: Normocephalic, without obvious abnormality Eyes: EOMI Lungs: Bibasilar crackles and scattered coarse breath sounds.  No wheezing Heart: regular rate and rhythm and S1, S2 normal Abdomen: normal findings: bowel sounds normal and soft, non-tender Extremities: No edema Skin: mobility and turgor normal Neurologic: Moves all 4 extremities  Consultants:   Palliative care  Hospice  Procedures:    N/A  Data Reviewed: I have personally reviewed following labs and imaging studies No results found for this or any previous visit (from the past 24 hour(s)).  Recent Results (from the past 240 hour(s))  Urine Culture     Status: None   Collection Time: 10/06/19  8:57 PM   Specimen: Urine, Random  Result Value Ref Range Status   Specimen Description URINE, RANDOM  Final   Special Requests NONE  Final   Culture   Final    NO GROWTH Performed at [facility], [address], [place], [place] [address]    Report Status 10/08/2019 FINAL  Final  SARS Coronavirus 2 by RT PCR (hospital order, performed in [facility] hospital lab) Nasopharyngeal Nasopharyngeal Swab     Status: None   Collection Time: 10/06/19  9:25 PM   Specimen: Nasopharyngeal Swab  Result Value Ref Range Status   SARS Coronavirus 2 NEGATIVE NEGATIVE Final    Comment: (NOTE) SARS-CoV-2 target nucleic acids are NOT DETECTED.  The SARS-CoV-2 RNA is generally detectable in upper and lower respiratory specimens during the acute phase of infection. The lowest concentration of SARS-CoV-2 viral copies this assay can detect is 250 copies / mL. A negative result does not preclude SARS-CoV-2 infection and should not be used as the sole basis for treatment or other patient management decisions.  A negative result may occur with improper specimen collection / handling, submission of specimen other than nasopharyngeal swab, presence of viral mutation(s) within the areas targeted by this assay, and inadequate number of viral copies (<250 copies / mL). A negative result must be combined with clinical observations, patient history, and epidemiological information.  Fact Sheet for Patients:   StrictlyIdeas.no  Fact Sheet for Healthcare Providers: BankingDealers.co.za  This test is not yet approved or  cleared by the [place] FDA and has been authorized for detection  and/or diagnosis of SARS-CoV-2 by FDA under an Emergency Use Authorization (EUA).  This EUA will remain in effect (meaning this test can be used) for the duration of the COVID-19 declaration under Section 564(b)(1) of the Act, 21 U.S.C. section 360bbb-3(b)(1), unless the authorization is terminated or revoked sooner.  Performed at [facility], [place] [address] [place], [place] [address]   Culture, blood (Routine X 2) w Reflex to ID Panel     Status: None (Preliminary result)   Collection Time: 10/06/19  9:29 PM   Specimen: BLOOD  Result Value Ref Range Status   Specimen Description BLOOD SITE NOT SPECIFIED  Final   Special Requests   Final    BOTTLES DRAWN AEROBIC AND ANAEROBIC Blood Culture results may not be optimal due to an inadequate volume of blood received in culture bottles   Culture   Final    NO GROWTH 2 DAYS Performed at [facility], [place] [address], [place], [place] [address]    Report Status PENDING  Incomplete  Culture, blood (Routine X 2) w Reflex to ID Panel     Status: None (Preliminary result)   Collection Time: 10/07/19  4:16 AM   Specimen: BLOOD  Result Value Ref  Range Status   Specimen Description BLOOD RIGHT ARM  Final   Special Requests   Final    BOTTLES DRAWN AEROBIC AND ANAEROBIC Blood Culture results may not be optimal due to an excessive volume of blood received in culture bottles PATIENT ON FOLLOWING ROZIPHEN ZITHROMYCIN   Culture   Final    NO GROWTH 1 DAY Performed at [facility] [address], [place], [place] [address]    Report Status PENDING  Incomplete     Radiology Studies: CT Head Wo Contrast  Result Date: 10/06/2019 CLINICAL DATA:  Altered mental status EXAM: CT HEAD WITHOUT CONTRAST TECHNIQUE: Contiguous axial images were obtained from the base of the skull through the vertex without intravenous contrast. COMPARISON:  MRI, CT 06/15/2003 FINDINGS: Brain: There is an indeterminate rounded 5 mm hyperdense focus along the  lateral left pons. Sequela of remote left pontine lacunar infarct is noted in a similar distribution to comparison MRI. Suspect remote bilateral basal ganglia infarcts. No evidence of acute infarction, hemorrhage, hydrocephalus, extra-axial collection or midline shift. Symmetric prominence of the ventricles, cisterns and sulci compatible with parenchymal volume loss. Patchy areas of white matter hypoattenuation are most compatible with chronic microvascular angiopathy. Vascular: Atherosclerotic calcification of the carotid siphons. No hyperdense vessel. Skull: No calvarial fracture or suspicious osseous lesion. No scalp swelling or hematoma. Sinuses/Orbits: Shrunken hyperdense appearance of the left globe is new since 2005. 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 No acute abnormality of the included orbits. Atherosclerotic calcification of the carotid siphons. No hyperdense vessel. Other: None IMPRESSION: 1. Indeterminate rounded 5 mm hyperdense focus along the lateral left pons. While this may represent a cavernous malformation, dolichoectasia of a crossing vessel or other indeterminate lesion, incompletely assessed given the amount of streak at the level of the skull base. Could be further visualized with MRI with contrast if patient is able to tolerate. 2. 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 3. Chronic microvascular angiopathy and parenchymal volume loss. 4. Shrunken hyperdense appearance of the left globe is new since 2005. 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 Electronically Signed   By: Lovena Le M.D.   On: 10/06/2019 20:14   MR BRAIN WO CONTRAST  Result Date: 10/07/2019 CLINICAL DATA:  Encephalopathy EXAM: MRI HEAD WITHOUT CONTRAST TECHNIQUE: Multiplanar, multiecho pulse sequences of the brain and surrounding structures were obtained without intravenous contrast. COMPARISON:  None. FINDINGS: Brain: No acute infarct,  acute hemorrhage or extra-axial collection. Early confluent hyperintense T2-weighted signal of the periventricular and deep white matter, most commonly due to chronic ischemic microangiopathy. There is generalized atrophy without lobar predilection. Single focus of chronic microhemorrhage in the left cerebellum. A partially empty sella is incidentally noted. Vascular: Normal flow voids. Skull and upper cervical spine: Normal marrow signal. Sinuses/Orbits: Negative. Other: None IMPRESSION: 1. No acute intracranial abnormality. 2. Generalized atrophy and findings of chronic ischemic microangiopathy. Electronically Signed   By: Ulyses Jarred M.D.   On: 10/07/2019 03:24   DG Chest Port 1 View  Result Date: 10/06/2019 CLINICAL DATA:  Near syncope. EXAM: PORTABLE CHEST 1 VIEW COMPARISON:  None FINDINGS: The lungs are hyperinflated. Mild to moderate severity atelectasis and/or infiltrate is seen within the right lung base. There is a small right pleural effusion. No pneumothorax is identified. The heart size and mediastinal contours are within normal limits. There is marked severity calcification of the aortic arch. Multilevel degenerative changes seen throughout the thoracic spine. IMPRESSION: 1.  Mild to moderate severity right basilar atelectasis and/or infiltrate. 2. Small right pleural effusion. Electronically Signed   By: Virgina Norfolk M.D.   On: 10/06/2019 19:23   MR BRAIN WO CONTRAST  Final Result    CT Head Wo Contrast  Final Result    DG Chest Port 1 View  Final Result       Scheduled Meds: . Netarsudil-Latanoprost  1 drop Right Eye QHS   PRN Meds: hydrALAZINE, ondansetron **OR** ondansetron (ZOFRAN) IV Continuous Infusions: . ampicillin-sulbactam (UNASYN) IV 1.5 g (10/08/19 1556)      LOS: 2 days  Time spent: Greater than 50% of the 35 minute visit was spent in counseling/coordination of care for the patient as laid out in the A&P.   Dwyane Dee, MD [facility] 10/08/2019, 4:14 PM   Contact via secure chat.  To contact the attending provider between 7A-7P or the covering provider during after hours 7P-7A, please log into the web site [email or web address] and access using universal [place] password for that web site. If you do not have the password, please call the hospital operator.

## 2019-10-08 NOTE — NC FL2 (Signed)
  [place] LEVEL OF CARE SCREENING TOOL     IDENTIFICATION  Patient Name: Autumn Wells Birthdate: 12-15-1927 Sex: female Admission Date (Current Location): 10/06/2019  [facility] and [place] Number:  Herbalist and Address:  [facility],  [place], [place]      Provider Number: [id number]  Attending Physician Name and Address:  Dwyane Dee, MD  Relative Name and Phone Number:  Towanda Malkin daughter [phone]    Current Level of Care: Hospital Recommended Level of Care: [place] Prior Approval Number:    Date Approved/Denied:   PASRR Number: [id number]  Discharge Plan: SNF    Current Diagnoses: Patient Active Problem List   Diagnosis Date Noted  . Aspiration into lower respiratory tract 10/07/2019  . Failure to thrive in adult   . Palliative care by specialist   . Goals of care, counseling/discussion   . Acute encephalopathy 10/06/2019  . Hypertension 10/06/2019  . Anemia 10/06/2019  . Acute lower UTI 10/06/2019  . CAP (community acquired pneumonia) 10/06/2019    Orientation RESPIRATION BLADDER Height & Weight     Self  O2 (2L O2 [place]) Incontinent Weight:   Height:     BEHAVIORAL SYMPTOMS/MOOD NEUROLOGICAL BOWEL NUTRITION STATUS      Incontinent Diet (Dysphagia)  AMBULATORY STATUS COMMUNICATION OF NEEDS Skin   Extensive Assist Verbally Other (Comment) (Dry, ecchymosis left arm and leg)                       Personal Care Assistance Level of Assistance  Bathing, Feeding, Dressing Bathing Assistance: Maximum assistance Feeding assistance: Limited assistance Dressing Assistance: Maximum assistance     Functional Limitations Info  Sight, Hearing, Speech Sight Info: Impaired Hearing Info: Impaired Speech Info: Impaired    SPECIAL CARE FACTORS FREQUENCY  PT (By licensed PT), OT (By licensed OT)     PT Frequency: Eval and Treat OT Frequency: Eval and Treat             Contractures Contractures Info: Not present    Additional Factors Info  Code Status, Allergies Code Status Info: DNR Allergies Info: No Known Allergies           Current Medications (10/08/2019):  This is the current hospital active medication list Current Facility-Administered Medications  Medication Dose Route Frequency Provider Last Rate Last Admin  . ampicillin-sulbactam (UNASYN) 1.5 g in sodium chloride 0.9 % 100 mL IVPB  1.5 g Intravenous Q6H Dwyane Dee, MD 200 mL/hr at 10/08/19 0958 1.5 g at 10/08/19 0958  . hydrALAZINE (APRESOLINE) injection 10 mg  10 mg Intravenous Q4H PRN Rise Patience, MD      . Netarsudil-Latanoprost 0.02-0.005 % SOLN 1 drop  1 drop Right Eye QHS Rise Patience, MD      . ondansetron [facility]) tablet 4 mg  4 mg Oral Q6H PRN Rise Patience, MD       Or  . ondansetron [facility]) injection 4 mg  4 mg Intravenous Q6H PRN Rise Patience, MD         Discharge Medications: Please see discharge summary for a list of discharge medications.  Relevant Imaging Results:  Relevant Lab Results:   Additional Information [phone]  Purcell Mouton, RN

## 2019-10-09 DIAGNOSIS — R531 Weakness: Secondary | ICD-10-CM

## 2019-10-09 MED ORDER — HYDRALAZINE HCL 20 MG/ML IJ SOLN: 10.0000 mg | INTRAMUSCULAR | Status: DC | PRN

## 2019-10-09 MED ORDER — LABETALOL HCL 5 MG/ML IV SOLN: 10.0000 mg | INTRAVENOUS | Status: DC | PRN

## 2019-10-09 NOTE — Progress Notes (Signed)
PROGRESS NOTE    Autumn Wells   NUU:[id number]  DOB: 1927/10/04  DOA: 10/06/2019     3  PCP: Maury Dus, MD  CC: weakness, dizziness, "declining"  Hospital Course: Autumn Wells is a 86 yo CF with PMH dementia, HTN who has had decreasing functional status and progressive decline over the past 2-3 months prior to hospitalization. There was concern on her workup for possible PNA and/or UTI. She was started on abx and admitted for further treatment. Due to her extremely poor quality of life, palliative care was also consulted to help guide family discussions regarding goals of care.  She did not have significant improvement with treatment and after family discussion with PC, decision was made to transition patient to hospice. However, prior to this, patient's daughter expressed wishes for patient to attempt rehab to gain further strength before being discharged home. GOC were again discussed with PC but daughter still insisted on rehab at discharge.    Interval History:  No events overnight.  Remains severely confused at baseline.  She could not answer any questions this morning and had a difficult time understanding simple questions such as how she was feeling.  She did appear to be in no distress and was resting in bed comfortably.  Old records reviewed in assessment of this patient  ROS: Review of systems not obtained due to patient factors.  Cognitive impairment  Assessment & Plan: Failure to thrive in adult - evaluated by [facility]; greatly appreciate assistance  -She is hospice appropriate with significant clinical decline over the past several months.  Daughter insistent on patient attempting rehab at discharge.  Low suspicion that she will benefit much from rehab, however will follow up referrals to rehab and discharge when bed available.  After rehab, plan appears to be discharging patient home to enroll with hospice.  She will be followed by palliative care at rehab as well.  Acute  encephalopathy - likely multifactorial in setting of dementia, possible infection, and worsening functional status with malnutrition state  Hypertension -Continue hydralazine as needed  Iron deficiency anemia, unspecified -Iron stores low.  Hemoglobin at baseline -Hold off on oral iron for now given imminent discharge to hospice/comfort  Acute lower UTI - unable to delieniate if symptomatic due to encephalopathy/dementia - s/p CTX/azithro; changed to Unasyn for now - continue abx  CAP (community acquired pneumonia) - concern was for aspiration on admission; had been on CTX/azithro; will change to Unasyn to cover anaerobes - RLL does appear to have infiltrate however not likely that this is sole culprit for her debilitated state and decline - continue unasyn, will complete course of likely Augmentin at discharge  Pressure injury of skin - continue wound care per nursing    Antimicrobials: Rocephin 10/06/2019 >> 10/07/2019 Azithromycin 10/06/2019>> 10/07/2019 Unasyn 10/08/2019>> present  DVT prophylaxis: SCD Code Status: DNR Family Communication: Patient's daughter Disposition Plan:  . Patient came from: Home        . Barriers to d/c OR conditions which need to be met to effect a safe d/c: None . The current disposition plan is discharge to: SNF/rehab   Objective: Blood pressure 128/72, pulse 76, temperature 98 F (36.7 C), temperature source Oral, resp. rate 18, height '5\' 5"'  (1.651 m), weight 57.2 kg, SpO2 93 %.   Intake/Output Summary (Last 24 hours) at 10/09/2019 1404 Last data filed at 10/09/2019 1115 Gross per 24 hour  Intake 280 ml  Output 1050 ml  Net -770 ml   Last Weight  Most  recent update: 10/08/2019  1:02 PM   Weight  57.2 kg (126 lb 1.7 oz)           Examination: General appearance: Chronically ill-appearing elderly woman laying in bed in no distress.  Could not correctly answer any questions this morning Head: Normocephalic, without obvious abnormality Eyes:  EOMI Lungs: Bibasilar crackles and scattered coarse breath sounds.  No wheezing Heart: regular rate and rhythm and S1, S2 normal Abdomen: normal findings: bowel sounds normal and soft, non-tender Extremities: No edema Skin: mobility and turgor normal Neurologic: Moves all 4 extremities  Consultants:   Palliative care  Hospice  Procedures:   N/A  Data Reviewed: I have personally reviewed following labs and imaging studies No results found for this or any previous visit (from the past 24 hour(s)).  Recent Results (from the past 240 hour(s))  Urine Culture     Status: None   Collection Time: 10/06/19  8:57 PM   Specimen: Urine, Random  Result Value Ref Range Status   Specimen Description URINE, RANDOM  Final   Special Requests NONE  Final   Culture   Final    NO GROWTH Performed at [facility], [address] [place], [place] [address]    Report Status 10/08/2019 FINAL  Final  SARS Coronavirus 2 by RT PCR (hospital order, performed in [facility] hospital lab) Nasopharyngeal Nasopharyngeal Swab     Status: None   Collection Time: 10/06/19  9:25 PM   Specimen: Nasopharyngeal Swab  Result Value Ref Range Status   SARS Coronavirus 2 NEGATIVE NEGATIVE Final    Comment: (NOTE) SARS-CoV-2 target nucleic acids are NOT DETECTED.  The SARS-CoV-2 RNA is generally detectable in upper and lower respiratory specimens during the acute phase of infection. The lowest concentration of SARS-CoV-2 viral copies this assay can detect is 250 copies / mL. A negative result does not preclude SARS-CoV-2 infection and should not be used as the sole basis for treatment or other patient management decisions.  A negative result may occur with improper specimen collection / handling, submission of specimen other than nasopharyngeal swab, presence of viral mutation(s) within the areas targeted by this assay, and inadequate number of viral copies (<250 copies / mL). A negative result must be  combined with clinical observations, patient history, and epidemiological information.  Fact Sheet for Patients:   StrictlyIdeas.no  Fact Sheet for Healthcare Providers: BankingDealers.co.za  This test is not yet approved or  cleared by the [place] FDA and has been authorized for detection and/or diagnosis of SARS-CoV-2 by FDA under an Emergency Use Authorization (EUA).  This EUA will remain in effect (meaning this test can be used) for the duration of the COVID-19 declaration under Section 564(b)(1) of the Act, 21 U.S.C. section 360bbb-3(b)(1), unless the authorization is terminated or revoked sooner.  Performed at [facility], [place] [address], [place], [place] [address]   Culture, blood (Routine X 2) w Reflex to ID Panel     Status: None (Preliminary result)   Collection Time: 10/06/19  9:29 PM   Specimen: BLOOD  Result Value Ref Range Status   Specimen Description BLOOD SITE NOT SPECIFIED  Final   Special Requests   Final    BOTTLES DRAWN AEROBIC AND ANAEROBIC Blood Culture results may not be optimal due to an inadequate volume of blood received in culture bottles   Culture   Final    NO GROWTH 2 DAYS Performed at [facility], [place]  [address] [place], [place] [address]    Report Status PENDING  Incomplete  Culture, blood (Routine X 2) w Reflex to ID Panel     Status: None (Preliminary result)   Collection Time: 10/07/19  4:16 AM   Specimen: BLOOD  Result Value Ref Range Status   Specimen Description BLOOD RIGHT ARM  Final   Special Requests   Final    BOTTLES DRAWN AEROBIC AND ANAEROBIC Blood Culture results may not be optimal due to an excessive volume of blood received in culture bottles PATIENT ON FOLLOWING ROZIPHEN ZITHROMYCIN   Culture   Final    NO GROWTH 1 DAY Performed at [facility], [place] [address] [place], [place] [address]    Report Status PENDING  Incomplete     Radiology Studies: No  results found. MR BRAIN WO CONTRAST  Final Result    CT Head Wo Contrast  Final Result    DG Chest Port 1 View  Final Result       Scheduled Meds: . Netarsudil-Latanoprost  1 drop Right Eye QHS   PRN Meds: hydrALAZINE, labetalol, ondansetron **OR** ondansetron (ZOFRAN) IV Continuous Infusions: . ampicillin-sulbactam (UNASYN) IV 1.5 g (10/09/19 1109)      LOS: 3 days  Time spent: Greater than 50% of the 35 minute visit was spent in counseling/coordination of care for the patient as laid out in the A&P.   Dwyane Dee, MD [facility] 10/09/2019, 2:04 PM   Contact via secure chat.  To contact the attending provider between 7A-7P or the covering provider during after hours 7P-7A, please log into the web site [email or web address] and access using universal [place] password for that web site. If you do not have the password, please call the hospital operator.

## 2019-10-09 NOTE — TOC Progression Note (Signed)
Transition of Care [facility]) - Progression Note    Patient Details  Name: Autumn Wells MRN: [id number] Date of Birth: [birth year]  Transition of Care [facility]) CM/SW [place], [place] Phone Number: 10/09/2019, 1:53 PM  Clinical Narrative:  Bed offers received.  Daughter Autumn Wells chooses [facility].  Spoke to [place] who asks that we call tomorrow between 10 and 2 to check on bed status as she is not in office.  She thinks she will not have an isolation bed [patient is not vaccinated] until Tuesday, but wants to make sure before making that statement definitively. TOC will continue to follow during the course of hospitalization.      Expected Discharge Plan: [place] Barriers to Discharge: No Barriers Identified  Expected Discharge Plan and Services Expected Discharge Plan: [place] arrangements for the past 2 months: Single Family Home                                       Social Determinants of Health (SDOH) Interventions    Readmission Risk Interventions No flowsheet data found.

## 2019-10-09 NOTE — Progress Notes (Signed)
Daily Progress Note   Patient Name: Autumn Wells       Date: 10/09/2019 DOB: 10/16/1927  Age: 86 y.o. MRN#: [id number] Attending Physician: Dwyane Dee, MD Primary Care Physician: Maury Dus, MD Admit Date: 10/06/2019  Reason for Consultation/Follow-up: Disposition and Establishing goals of care  Subjective: Chart review was performed.   Spoke with primary RN - no acute concerns or events overnight.  Went to visit patient at bedside - no family present. Patient was lying in bed awake. She is oriented x1 to self only. No gestures or non-verbal signs of discomfort noted.  Went to speak with daughter/Susan in person. After our conversation and further information from hospice liaison yesterday, Manuela Schwartz has decided that she would like her mother to discharge to SNF rehab with outpatient Palliative Care to follow. Susan's ultimate goal is for her mom to rehab enough that she can bring her home with hospice support.   Addressed all questions and concerns. Encouraged to call with questions and/or concerns.  Length of Stay: 3  Current Medications: Scheduled Meds:  . Netarsudil-Latanoprost  1 drop Right Eye QHS    Continuous Infusions: . ampicillin-sulbactam (UNASYN) IV 1.5 g (10/09/19 0630)    PRN Meds: hydrALAZINE, labetalol, ondansetron **OR** ondansetron (ZOFRAN) IV  Physical Exam Constitutional:      General: She is not in acute distress. Pulmonary:     Effort: Pulmonary effort is normal. No respiratory distress.  Skin:    General: Skin is warm and dry.  Neurological:     Mental Status: She is alert. She is disoriented.     Motor: Weakness present.  Psychiatric:        Cognition and Memory: Cognition is impaired.             Vital Signs: BP (!) 157/70   Pulse (!) 53   Temp 98 F (36.7  C) (Oral)   Resp 16   Ht 5\' 5"  (1.651 m)   Wt 57.2 kg   SpO2 95%   BMI 20.98 kg/m  SpO2: SpO2: 95 % O2 Device: O2 Device: Room Air O2 Flow Rate:    Intake/output summary:   Intake/Output Summary (Last 24 hours) at 10/09/2019 1012 Last data filed at 10/08/2019 1807 Gross per 24 hour  Intake 560 ml  Output 350 ml  Net 210 ml   LBM: Last BM Date: 10/07/19 Baseline Weight: Weight: 57.2 kg Most recent weight: Weight: 57.2 kg       Palliative Assessment/Data: PPS 30%    Flowsheet Rows     Most Recent Value  Intake Tab  Referral Department Hospitalist  Unit at Time of Referral ER  Palliative Care Primary Diagnosis Neurology  Date Notified 10/07/19  Palliative Care Type New Palliative care  Reason for referral Clarify Goals of Care  Date of Admission 10/06/19  Date first seen by Palliative Care 10/07/19  # of days Palliative referral response time 0 Day(s)  # of days IP prior to Palliative referral 1  Clinical Assessment  Psychosocial & Spiritual Assessment  Palliative Care Outcomes      Patient Active Problem List   Diagnosis Date Noted  . Pressure injury of skin 10/08/2019  . Altered mental status   .  Aspiration into lower respiratory tract 10/07/2019  . Failure to thrive in adult   . Palliative care by specialist   . Goals of care, counseling/discussion   . Acute encephalopathy 10/06/2019  . Hypertension 10/06/2019  . Iron deficiency anemia, unspecified 10/06/2019  . Acute lower UTI 10/06/2019  . CAP (community acquired pneumonia) 10/06/2019    Palliative Care Assessment & Plan   HPI: 86 y.o. female  with past medical history of hypertension, anemia, possible dementia, blindness, and decreased hearing admitted on 10/06/2019 after presenting to the ED with complaint of increased lethargy, weakness, and confusion over the last few weeks per family. Family reports she has been unable to get OOB for the last 1-2 weeks and also reports general decline over the last  2-3 months. In the ED, CT head and MRI brain were negative and hemoglobin 9 (baseline around 11). UA is concerning for possible UTI and chest x-ray shows possible infiltrates. Patient has been started on empiric antibiotics and admitted to the Hospitalist service for further management of acute encephalopathy.   Assessment: Acute encephalopathy Dementia Lower UTI Aspiration pneumonia Failure to thrive   Recommendations/Plan:  Continue current medical treatment  Continue DNR/DNI as previously documented  Daughter has decided that she would like patient discharge to SNF rehab when medically stable. After rehab, she would like her mom to return home with hospice support - TOC and hospice liaison notified  Will have outpatient Palliative Care follow  PMT will continue to follow holistically  Goals of Care and Additional Recommendations:  Limitations on Scope of Treatment: Full Scope Treatment  Code Status:  DNR  Prognosis:   < 6 months  Discharge Planning:  [place] for rehab with Palliative care service follow-up  Care plan was discussed with primary RN, daughter/Susan, TOC, hospice liaison, Dr. Sabino Gasser  Thank you for allowing the Palliative Medicine Team to assist in the care of this patient.   Total Time 15 minutes Prolonged Time Billed  no        Greater than 50% of this time was spent counseling and coordinating care related to the above assessment and plan.  Nesbit Michon M. Tamala Julian, [place] Palliative Medicine Team Team Phone # [phone]

## 2019-10-10 NOTE — Progress Notes (Signed)
PROGRESS NOTE    Autumn Wells   FGH:[id number]  DOB: 1927-09-23  DOA: 10/06/2019     4  PCP: Maury Dus, MD  CC: weakness, dizziness, "declining"  Hospital Course: Autumn Wells is a 86 yo CF with PMH dementia, HTN who has had decreasing functional status and progressive decline over the past 2-3 months prior to hospitalization. There was concern on her workup for possible PNA and/or UTI. She was started on abx and admitted for further treatment. Due to her extremely poor quality of life, palliative care was also consulted to help guide family discussions regarding goals of care.  She did not have significant improvement with treatment and after family discussion with PC, decision was made to transition patient to hospice. However, prior to this, patient's daughter expressed wishes for patient to attempt rehab to gain further strength before being discharged home. GOC were again discussed with PC but daughter still insisted on rehab at discharge.   Interval History:  No events overnight.  Pleasantly confused still resting in bed in no distress.   Old records reviewed in assessment of this patient  ROS: Review of systems not obtained due to patient factors.  Cognitive impairment  Assessment & Plan: Failure to thrive in adult - evaluated by [facility]; greatly appreciate assistance  -She is hospice appropriate with significant clinical decline over the past several months.  Daughter insistent on patient attempting rehab at discharge.  Low suspicion that she will benefit much from rehab, however will follow up referrals to rehab and discharge when bed available.  After rehab, plan appears to be discharging patient home to enroll with hospice.  She will be followed by palliative care at rehab as well.  Acute encephalopathy - likely multifactorial in setting of dementia, possible infection, and worsening functional status with malnutrition state  Hypertension -Continue hydralazine as needed  Iron  deficiency anemia, unspecified -Iron stores low.  Hemoglobin at baseline -Hold off on oral iron for now given imminent discharge to hospice/comfort  Acute lower UTI - unable to delieniate if symptomatic due to encephalopathy/dementia - s/p CTX/azithro; changed to Unasyn for now - continue abx  CAP (community acquired pneumonia) - concern was for aspiration on admission; had been on CTX/azithro; will change to Unasyn to cover anaerobes - RLL does appear to have infiltrate however not likely that this is sole culprit for her debilitated state and decline - continue unasyn, will complete course of likely Augmentin at discharge  Pressure injury of skin - continue wound care per nursing    Antimicrobials: Rocephin 10/06/2019 >> 10/07/2019 Azithromycin 10/06/2019>> 10/07/2019 Unasyn 10/08/2019>> present  DVT prophylaxis: SCD Code Status: DNR Family Communication: Patient's daughter Disposition Plan:  . Patient came from: Home        . Barriers to d/c OR conditions which need to be met to effect a safe d/c: None . The current disposition plan is discharge to: SNF/rehab Status is: Inpatient  Remains inpatient appropriate because:Altered mental status, IV treatments appropriate due to intensity of illness or inability to take PO and Inpatient level of care appropriate due to severity of illness   Dispo: The patient is from: Home              Anticipated d/c is to: SNF              Anticipated d/c date is: 1 day              Patient currently is not medically stable to d/c.  Objective:  Blood pressure (!) 159/85, pulse 78, temperature 97.6 F (36.4 C), temperature source Oral, resp. rate 16, height '5\' 5"'  (1.651 m), weight 57.2 kg, SpO2 94 %.   Intake/Output Summary (Last 24 hours) at 10/10/2019 1453 Last data filed at 10/10/2019 1005 Gross per 24 hour  Intake 0 ml  Output 750 ml  Net -750 ml   Last Weight  Most recent update: 10/08/2019  1:02 PM   Weight  57.2 kg (126 lb 1.7 oz)            Examination: General appearance: Chronically ill-appearing elderly woman laying in bed in no distress. Extremely demented Head: Normocephalic, without obvious abnormality Eyes: EOMI Lungs: Bibasilar crackles and scattered coarse breath sounds.  No wheezing Heart: regular rate and rhythm and S1, S2 normal Abdomen: normal findings: bowel sounds normal and soft, non-tender Extremities: No edema Skin: mobility and turgor normal Neurologic: Moves all 4 extremities  Consultants:   Palliative care  Hospice  Procedures:   N/A  Data Reviewed: I have personally reviewed following labs and imaging studies No results found for this or any previous visit (from the past 24 hour(s)).  Recent Results (from the past 240 hour(s))  Urine Culture     Status: None   Collection Time: 10/06/19  8:57 PM   Specimen: Urine, Random  Result Value Ref Range Status   Specimen Description URINE, RANDOM  Final   Special Requests NONE  Final   Culture   Final    NO GROWTH Performed at [facility], [address] [place], [place] [address]    Report Status 10/08/2019 FINAL  Final  SARS Coronavirus 2 by RT PCR (hospital order, performed in [facility] hospital lab) Nasopharyngeal Nasopharyngeal Swab     Status: None   Collection Time: 10/06/19  9:25 PM   Specimen: Nasopharyngeal Swab  Result Value Ref Range Status   SARS Coronavirus 2 NEGATIVE NEGATIVE Final    Comment: (NOTE) SARS-CoV-2 target nucleic acids are NOT DETECTED.  The SARS-CoV-2 RNA is generally detectable in upper and lower respiratory specimens during the acute phase of infection. The lowest concentration of SARS-CoV-2 viral copies this assay can detect is 250 copies / mL. A negative result does not preclude SARS-CoV-2 infection and should not be used as the sole basis for treatment or other patient management decisions.  A negative result may occur with improper specimen collection / handling, submission of specimen  other than nasopharyngeal swab, presence of viral mutation(s) within the areas targeted by this assay, and inadequate number of viral copies (<250 copies / mL). A negative result must be combined with clinical observations, patient history, and epidemiological information.  Fact Sheet for Patients:   StrictlyIdeas.no  Fact Sheet for Healthcare Providers: BankingDealers.co.za  This test is not yet approved or  cleared by the [place] FDA and has been authorized for detection and/or diagnosis of SARS-CoV-2 by FDA under an Emergency Use Authorization (EUA).  This EUA will remain in effect (meaning this test can be used) for the duration of the COVID-19 declaration under Section 564(b)(1) of the Act, 21 U.S.C. section 360bbb-3(b)(1), unless the authorization is terminated or revoked sooner.  Performed at [facility], [place] [address], [place], [place] [address]   Culture, blood (Routine X 2) w Reflex to ID Panel     Status: None (Preliminary result)   Collection Time: 10/06/19  9:29 PM   Specimen: BLOOD  Result Value Ref Range Status   Specimen Description BLOOD  SITE NOT SPECIFIED  Final   Special Requests   Final    BOTTLES DRAWN AEROBIC AND ANAEROBIC Blood Culture results may not be optimal due to an inadequate volume of blood received in culture bottles   Culture   Final    NO GROWTH 3 DAYS Performed at [facility], [place] [address] [place], [place] [address]    Report Status PENDING  Incomplete  Culture, blood (Routine X 2) w Reflex to ID Panel     Status: None (Preliminary result)   Collection Time: 10/07/19  4:16 AM   Specimen: BLOOD  Result Value Ref Range Status   Specimen Description BLOOD RIGHT ARM  Final   Special Requests   Final    BOTTLES DRAWN AEROBIC AND ANAEROBIC Blood Culture results may not be optimal due to an excessive volume of blood received in culture bottles PATIENT ON FOLLOWING ROZIPHEN  ZITHROMYCIN   Culture   Final    NO GROWTH 2 DAYS Performed at [facility], [place] [address] [place], [place] [address]    Report Status PENDING  Incomplete     Radiology Studies: No results found. MR BRAIN WO CONTRAST  Final Result    CT Head Wo Contrast  Final Result    DG Chest Port 1 View  Final Result       Scheduled Meds: . Netarsudil-Latanoprost  1 drop Right Eye QHS   PRN Meds: hydrALAZINE, labetalol, ondansetron **OR** ondansetron (ZOFRAN) IV Continuous Infusions: . ampicillin-sulbactam (UNASYN) IV 1.5 g (10/10/19 0940)      LOS: 4 days  Time spent: Greater than 50% of the 35 minute visit was spent in counseling/coordination of care for the patient as laid out in the A&P.   Dwyane Dee, MD [facility] 10/10/2019, 2:53 PM   Contact via secure chat.  To contact the attending provider between 7A-7P or the covering provider during after hours 7P-7A, please log into the web site [email or web address] and access using universal [place] password for that web site. If you do not have the password, please call the hospital operator.

## 2019-10-10 NOTE — Progress Notes (Signed)
Hydrologist [facility])  Hospital Liaison: RN note         Notified by [facility] manager of patient/family request for [facility] Palliative services at [facility] after discharge.         Writer spoke with daughter, Manuela Schwartz to confirm interest and explain services.               [place] Palliative team will follow up with family and patient at [facility] after discharge.         Please call with any hospice or palliative related questions.         Thank you for this referral.         Farrel Gordon, RN, CCM  [place] (listed on [place] under Hospice/[facility])    [phone]

## 2019-10-10 NOTE — TOC Progression Note (Signed)
Transition of Care [facility]) - Progression Note    Patient Details  Name: Autumn Wells MRN: [id number] Date of Birth: 1927/11/02  Transition of Care [facility]) CM/SW [place], [place] Phone Number: [phone] 10/10/2019, 11:52 AM  Clinical Narrative:     CSW spoke with Caryl Pina at [facility] [phone] about isolation room availability update.  Caryl Pina stated there would not be an isolation room available for the patient today but there may be on available tomorrow.  Caryl Pina stated the patient would also need her COVID-19 test before d/c to SNF.    CSW spoke with patient's daughter Manuela Schwartz to update her on bed status.  Manuela Schwartz stated she would really like for placement at [facility].  This CSW informed Manuela Schwartz her mother was ready for d/c but Manuela Schwartz stated she really wanted her mother placed at [facility].   Expected Discharge Plan: [place] Barriers to Discharge: No Barriers Identified  Expected Discharge Plan and Services Expected Discharge Plan: [place] arrangements for the past 2 months: Single Family Home                                       Social Determinants of Health (SDOH) Interventions    Readmission Risk Interventions No flowsheet data found.

## 2019-10-11 LAB — CULTURE, BLOOD (ROUTINE X 2): Culture: NO GROWTH

## 2019-10-11 NOTE — Consult Note (Signed)
WOC Nurse Consult Note: Reason for Consult:skin tear left arm sustained during transport by EMS. Dry gauze was placed over skin injury and is now adherent. Wound type: trauma Pressure Injury POA: N/A Measurements: 5cm x 4cm x 0.2cm full thickness Skin tear protocol enacted.  Guidance for removal of adherent dressing provided using hydrogen peroxide. Topical care ordered is with xeroform gauze Kellie Simmering # [id number]), dry gauze, Kerlix and paper tape.  [facility] Gershon Cull is the patient's Bedside RN today and is comfortable with the POC.  [place] nursing team will not follow, but will remain available to this patient, the nursing and medical teams.  Please re-consult if needed. Thanks, Maudie Flakes, MSN, RN, [place], Arther Abbott  Pager# [phone]

## 2019-10-11 NOTE — Progress Notes (Signed)
Wound to patient's left posterior forearm 5 cm L x 4 cm W - site wrapped in gauze.  Patient's daughter at bedside.  Per patient's daughter, skin tear to left forearm occurred when EMS was loading patient onto stretcher to take to the hospital.  RN attempted to change dressing.  Gauze adhered to wound.  RN soaked dressing with 3/4 bottle NS to loosen gauze; however, dressing still adhered to site.  Excess guaze trimmed with 1/2 inch remaining around site and Allevyn applied.  RN notified patient's daughter that gauze was not loosening and a WOC consult would be placed.   RN received call back from [facility] nurse.  Advised to soak dressing in Peroxide to loosen.  If unable to loosen, dressing will need to be removed which can create an open wound.  RN to apply Xeroform and then gauze to site once adhered gauze is removed.  Patient's daughter notified and verbalized understanding.

## 2019-10-11 NOTE — Progress Notes (Signed)
PROGRESS NOTE    Autumn Wells   KDX:[id number]  DOB: 09/17/1927  DOA: 10/06/2019     5  PCP: Maury Dus, MD  CC: weakness, dizziness, "declining"  Hospital Course: Ms. Summerson is a 86 yo CF with PMH dementia, HTN who has had decreasing functional status and progressive decline over the past 2-3 months prior to hospitalization. There was concern on her workup for possible PNA and/or UTI. She was started on abx and admitted for further treatment. Due to her extremely poor quality of life, palliative care was also consulted to help guide family discussions regarding goals of care.  She did not have significant improvement with treatment and after family discussion with PC, decision was made to transition patient to hospice. However, prior to this, patient's daughter expressed wishes for patient to attempt rehab to gain further strength before being discharged home. GOC were again discussed with PC but daughter still insisted on rehab at discharge.   Interval History:  No events overnight.  Daughter present bedside this am. Patient remains very demented and confused. Daughter is hopeful for patient to regain some function at rehab in order to then take her home. We also discussed possibility of patient never achieving this and daughter also understands this possibility.   Old records reviewed in assessment of this patient  ROS: Review of systems not obtained due to patient factors.  Cognitive impairment  Assessment & Plan: Failure to thrive in adult - evaluated by [facility]; greatly appreciate assistance  -She is hospice appropriate with significant clinical decline over the past several months.  Daughter insistent on patient attempting rehab at discharge.  Low suspicion that she will benefit much from rehab, however will follow up referrals to rehab and discharge when bed available.  After rehab, plan appears to be discharging patient home to enroll with hospice.  She will be followed by palliative  care at rehab as well.  Acute encephalopathy - likely multifactorial in setting of dementia, possible infection, and worsening functional status with malnutrition state  Hypertension -Continue hydralazine as needed  Iron deficiency anemia, unspecified -Iron stores low.  Hemoglobin at baseline -Hold off on oral iron for now given imminent discharge to hospice/comfort  Acute lower UTI - unable to delieniate if symptomatic due to encephalopathy/dementia - s/p CTX/azithro; changed to Unasyn for now - continue abx  CAP (community acquired pneumonia) - concern was for aspiration on admission; had been on CTX/azithro; will change to Unasyn to cover anaerobes - RLL does appear to have infiltrate however not likely that this is sole culprit for her debilitated state and decline - continue unasyn, will complete course of likely Augmentin at discharge  Pressure injury of skin - continue wound care per nursing    Antimicrobials: Rocephin 10/06/2019 >> 10/07/2019 Azithromycin 10/06/2019>> 10/07/2019 Unasyn 10/08/2019>> present  DVT prophylaxis: SCD Code Status: DNR Family Communication: Patient's daughter Disposition Plan:  . Patient came from: Home        . Barriers to d/c OR conditions which need to be met to effect a safe d/c: None . The current disposition plan is discharge to: SNF/rehab Status is: Inpatient  Remains inpatient appropriate because:Altered mental status, IV treatments appropriate due to intensity of illness or inability to take PO and Inpatient level of care appropriate due to severity of illness   Dispo: The patient is from: Home              Anticipated d/c is to: SNF  Anticipated d/c date is: 1 day              Patient currently is not medically stable to d/c.  Objective: Blood pressure (!) 142/69, pulse 66, temperature 98.2 F (36.8 C), temperature source Oral, resp. rate 16, height '5\' 5"'  (1.651 m), weight 57.2 kg, SpO2 96 %.   Intake/Output Summary  (Last 24 hours) at 10/11/2019 1604 Last data filed at 10/11/2019 0931 Gross per 24 hour  Intake 220 ml  Output 1250 ml  Net -1030 ml   Last Weight  Most recent update: 10/08/2019  1:02 PM   Weight  57.2 kg (126 lb 1.7 oz)           Examination: General appearance: Chronically ill-appearing elderly woman laying in bed in no distress. 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  Recent Results (from the past 240 hour(s))  Urine Culture     Status: None   Collection Time: 10/06/19  8:57 PM   Specimen: Urine, Random  Result Value Ref Range Status   Specimen Description URINE, RANDOM  Final   Special Requests NONE  Final   Culture   Final    NO GROWTH Performed at [facility], [address], [place], [place] [address]    Report Status 10/08/2019 FINAL  Final  SARS Coronavirus 2 by RT PCR (hospital order, performed in [facility] hospital lab) Nasopharyngeal Nasopharyngeal Swab     Status: None   Collection Time: 10/06/19  9:25 PM   Specimen: Nasopharyngeal Swab  Result Value Ref Range Status   SARS Coronavirus 2 NEGATIVE NEGATIVE Final    Comment: (NOTE) SARS-CoV-2 target nucleic acids are NOT DETECTED.  The SARS-CoV-2 RNA is generally detectable in upper and lower respiratory specimens during the acute phase of infection.  The lowest concentration of SARS-CoV-2 viral copies this assay can detect is 250 copies / mL. A negative result does not preclude SARS-CoV-2 infection and should not be used as the sole basis for treatment or other patient management decisions.  A negative result may occur with improper specimen collection / handling, submission of specimen other than nasopharyngeal swab, presence of viral mutation(s) within the areas targeted by this assay, and inadequate number of viral copies (<250 copies / mL). A negative result must be combined with clinical observations, patient history, and epidemiological information.  Fact Sheet for Patients:   StrictlyIdeas.no  Fact Sheet for Healthcare Providers: BankingDealers.co.za  This test is not yet approved or  cleared by the [place] FDA and has been authorized for detection and/or diagnosis of SARS-CoV-2 by FDA under an Emergency Use Authorization (EUA).  This EUA will remain in effect (meaning this test can be used) for the duration of the COVID-19 declaration under Section 564(b)(1) of the Act, 21 U.S.C. section 360bbb-3(b)(1), unless the authorization is terminated or revoked sooner.  Performed at [facility],  [address], [place], [place] [address]   Culture, blood (Routine X 2) w Reflex to ID Panel     Status: None   Collection Time: 10/06/19  9:29 PM   Specimen: BLOOD  Result Value Ref Range Status   Specimen Description BLOOD SITE NOT SPECIFIED  Final   Special Requests   Final    BOTTLES DRAWN AEROBIC AND ANAEROBIC Blood Culture results may not be optimal due to an inadequate volume of blood received in culture bottles   Culture   Final    NO GROWTH 5 DAYS Performed at [facility], [place] [address] [place], [place] [address]    Report Status 10/11/2019 FINAL  Final  Culture, blood (Routine X 2) w Reflex to ID Panel     Status: None (Preliminary result)   Collection  Time: 10/07/19  4:16 AM   Specimen: BLOOD  Result Value Ref Range Status   Specimen Description BLOOD RIGHT ARM  Final   Special Requests   Final    BOTTLES DRAWN AEROBIC AND ANAEROBIC Blood Culture results may not be optimal due to an excessive volume of blood received in culture bottles PATIENT ON FOLLOWING ROZIPHEN ZITHROMYCIN   Culture   Final    NO GROWTH 4 DAYS Performed at [facility], [place] [address] [place], [place] [address]    Report Status PENDING  Incomplete     Radiology Studies: No results found. MR BRAIN WO CONTRAST  Final Result    CT Head Wo Contrast  Final Result    DG Chest Port 1 View  Final Result       Scheduled Meds: . Netarsudil-Latanoprost  1 drop Right Eye QHS   PRN Meds: hydrALAZINE, labetalol, ondansetron **OR** ondansetron (ZOFRAN) IV Continuous Infusions: . ampicillin-sulbactam (UNASYN) IV 1.5 g (10/11/19 1257)      LOS: 5 days  Time spent: Greater than 50% of the 35 minute visit was spent in counseling/coordination of care for the patient as laid out in the A&P.   Dwyane Dee, MD [facility] 10/11/2019, 4:04 PM   Contact via secure chat.  To contact the attending provider between 7A-7P or the covering provider during after hours 7P-7A, please log into the web site [email or web address] and access using universal [place] password for that web site. If you do not have the password, please call the hospital operator.

## 2019-10-11 NOTE — Care Management Important Message (Signed)
Important Message  Patient Details IM Letter given to [name] to present to the Patient Name: Autumn Wells MRN: [id number] Date of Birth: [birth year]   Medicare Important Message Given:  Yes     Kerin Salen 10/11/2019, 11:23 AM

## 2019-10-11 NOTE — Progress Notes (Signed)
Dressing to left posterior forearm changed with WOC RN present.  New dressing clean, dry, intact.

## 2019-10-12 DIAGNOSIS — J189 Pneumonia, unspecified organism: Secondary | ICD-10-CM | POA: Diagnosis not present

## 2019-10-12 DIAGNOSIS — R5381 Other malaise: Secondary | ICD-10-CM | POA: Diagnosis not present

## 2019-10-12 DIAGNOSIS — M6281 Muscle weakness (generalized): Secondary | ICD-10-CM | POA: Diagnosis not present

## 2019-10-12 DIAGNOSIS — R600 Localized edema: Secondary | ICD-10-CM | POA: Diagnosis not present

## 2019-10-12 DIAGNOSIS — D509 Iron deficiency anemia, unspecified: Secondary | ICD-10-CM | POA: Diagnosis not present

## 2019-10-12 DIAGNOSIS — L8990 Pressure ulcer of unspecified site, unspecified stage: Secondary | ICD-10-CM | POA: Diagnosis not present

## 2019-10-12 DIAGNOSIS — F05 Delirium due to known physiological condition: Secondary | ICD-10-CM | POA: Diagnosis not present

## 2019-10-12 DIAGNOSIS — F0150 Vascular dementia without behavioral disturbance: Secondary | ICD-10-CM | POA: Diagnosis not present

## 2019-10-12 DIAGNOSIS — Z681 Body mass index (BMI) 19 or less, adult: Secondary | ICD-10-CM | POA: Diagnosis not present

## 2019-10-12 DIAGNOSIS — J069 Acute upper respiratory infection, unspecified: Secondary | ICD-10-CM | POA: Diagnosis not present

## 2019-10-12 DIAGNOSIS — F331 Major depressive disorder, recurrent, moderate: Secondary | ICD-10-CM | POA: Diagnosis not present

## 2019-10-12 DIAGNOSIS — M810 Age-related osteoporosis without current pathological fracture: Secondary | ICD-10-CM | POA: Diagnosis not present

## 2019-10-12 DIAGNOSIS — I10 Essential (primary) hypertension: Secondary | ICD-10-CM | POA: Diagnosis not present

## 2019-10-12 DIAGNOSIS — F0280 Dementia in other diseases classified elsewhere without behavioral disturbance: Secondary | ICD-10-CM | POA: Diagnosis not present

## 2019-10-12 DIAGNOSIS — E860 Dehydration: Secondary | ICD-10-CM | POA: Diagnosis not present

## 2019-10-12 DIAGNOSIS — B9689 Other specified bacterial agents as the cause of diseases classified elsewhere: Secondary | ICD-10-CM | POA: Diagnosis not present

## 2019-10-12 DIAGNOSIS — E46 Unspecified protein-calorie malnutrition: Secondary | ICD-10-CM | POA: Diagnosis not present

## 2019-10-12 DIAGNOSIS — I69828 Other speech and language deficits following other cerebrovascular disease: Secondary | ICD-10-CM | POA: Diagnosis not present

## 2019-10-12 DIAGNOSIS — R1312 Dysphagia, oropharyngeal phase: Secondary | ICD-10-CM | POA: Diagnosis not present

## 2019-10-12 DIAGNOSIS — I672 Cerebral atherosclerosis: Secondary | ICD-10-CM | POA: Diagnosis not present

## 2019-10-12 DIAGNOSIS — R41841 Cognitive communication deficit: Secondary | ICD-10-CM | POA: Diagnosis not present

## 2019-10-12 DIAGNOSIS — G9340 Encephalopathy, unspecified: Secondary | ICD-10-CM | POA: Diagnosis not present

## 2019-10-12 DIAGNOSIS — H409 Unspecified glaucoma: Secondary | ICD-10-CM | POA: Diagnosis not present

## 2019-10-12 DIAGNOSIS — N390 Urinary tract infection, site not specified: Secondary | ICD-10-CM | POA: Diagnosis not present

## 2019-10-12 DIAGNOSIS — G9341 Metabolic encephalopathy: Secondary | ICD-10-CM | POA: Diagnosis not present

## 2019-10-12 DIAGNOSIS — E43 Unspecified severe protein-calorie malnutrition: Secondary | ICD-10-CM | POA: Diagnosis not present

## 2019-10-12 DIAGNOSIS — G301 Alzheimer's disease with late onset: Secondary | ICD-10-CM | POA: Diagnosis not present

## 2019-10-12 DIAGNOSIS — F329 Major depressive disorder, single episode, unspecified: Secondary | ICD-10-CM | POA: Diagnosis not present

## 2019-10-12 DIAGNOSIS — J690 Pneumonitis due to inhalation of food and vomit: Secondary | ICD-10-CM | POA: Diagnosis not present

## 2019-10-12 DIAGNOSIS — M2550 Pain in unspecified joint: Secondary | ICD-10-CM | POA: Diagnosis not present

## 2019-10-12 DIAGNOSIS — R54 Age-related physical debility: Secondary | ICD-10-CM | POA: Diagnosis not present

## 2019-10-12 DIAGNOSIS — M6289 Other specified disorders of muscle: Secondary | ICD-10-CM | POA: Diagnosis not present

## 2019-10-12 DIAGNOSIS — D649 Anemia, unspecified: Secondary | ICD-10-CM | POA: Diagnosis not present

## 2019-10-12 DIAGNOSIS — R2681 Unsteadiness on feet: Secondary | ICD-10-CM | POA: Diagnosis not present

## 2019-10-12 DIAGNOSIS — Z7401 Bed confinement status: Secondary | ICD-10-CM | POA: Diagnosis not present

## 2019-10-12 DIAGNOSIS — R627 Adult failure to thrive: Secondary | ICD-10-CM | POA: Diagnosis not present

## 2019-10-12 LAB — CULTURE, BLOOD (ROUTINE X 2): Culture: NO GROWTH

## 2019-10-12 NOTE — TOC Progression Note (Signed)
Transition of Care [facility]) - Progression Note    Patient Details  Name: Autumn Wells MRN: [id number] Date of Birth: 1927-07-20  Transition of Care [facility] CM/SW Contact  Purcell Mouton, RN Phone Number: 10/12/2019, 11:55 AM  Clinical Narrative:    Spoke with pt's daughter Manuela Schwartz who will go to [employer] today. No COVID test needs.    Expected Discharge Plan: [place] Barriers to Discharge: No Barriers Identified  Expected Discharge Plan and Services Expected Discharge Plan: [place] arrangements for the past 2 months: Single Family Home Expected Discharge Date: 10/12/19                                     Social Determinants of Health (SDOH) Interventions    Readmission Risk Interventions No flowsheet data found.

## 2019-10-12 NOTE — Discharge Summary (Addendum)
Physician Discharge Summary  Autumn Wells AUQ:[id number] DOB: 12/15/1927 DOA: 10/06/2019  PCP: Maury Dus, MD  Admit date: 10/06/2019 Discharge date: 10/12/2019  Admitted From: Home Disposition: Skilled nursing facility  Recommendations for Outpatient Follow-up:  Follow up with PCP in 1-2 weeks Palliative care services to follow-up at nursing facility.  If condition declines, would consider transitioning to hospice  Discharge Condition: Stable CODE STATUS: DNR Diet recommendation: Dysphagia 2 diet with thin liquids, heart healthy  Brief/Interim Summary: Autumn Wells is a 86 yo CF with PMH dementia, HTN who has had decreasing functional status and progressive decline over the past 2-3 months prior to hospitalization. There was concern on her workup for possible PNA and/or UTI. She was started on abx and admitted for further treatment. Due to her extremely poor quality of life, palliative care was also consulted to help guide family discussions regarding goals of care.  After discussion with palliative care, family has decided to discharge the patient to skilled nursing facility with palliative care services to follow.  Eventually, patient may need to transition to hospice if she does not make any meaningful improvements.  Discharge Diagnoses:  Principal Problem:   Acute encephalopathy Active Problems:   Hypertension   Iron deficiency anemia, unspecified   Acute lower UTI   CAP (community acquired pneumonia)   Failure to thrive in adult   Palliative care by specialist   Goals of care, counseling/discussion   Pressure injury of skin   Weakness  Failure to thrive in adult - evaluated by PC; greatly appreciate assistance  -After several discussions with palliative care, family has elected to discharge to SNF with palliative services to follow   Acute encephalopathy - likely multifactorial in setting of dementia, possible infection, and worsening functional status with malnutrition state    Hypertension -Continue hydralazine as needed -discontinue home dose of lisinopril -BP ranging in the 140s range which is acceptable for her age   Iron deficiency anemia, unspecified -Iron stores low.  Hemoglobin at baseline -Hold off on oral iron for now given imminent discharge to hospice/comfort   Acute lower UTI - unable to delieniate if symptomatic due to encephalopathy/dementia - s/p CTX/azithro; changed to Unasyn for now - urine culture with no growth -antibiotics discontinued   CAP (community acquired pneumonia) - concern was for aspiration on admission; had been on CTX/azithro;  Subsequently change to Unasyn to cover anaerobes - RLL does appear to have infiltrate however not likely that this is sole culprit for her debilitated state and decline - she has completed 5 days of IV antibiotics in the hospital which should adequately treat pneumonia -she does not have any fever, shortness of breath or cough at this time   Pressure injury of skin - continue wound care per nursing  Pressure Injury 10/07/19 Buttocks Left Stage 2 -  Partial thickness loss of dermis presenting as a shallow open injury with a red, pink wound bed without slough. (Active)  10/07/19 1730  Location: Buttocks  Location Orientation: Left  Staging: Stage 2 -  Partial thickness loss of dermis presenting as a shallow open injury with a red, pink wound bed without slough.  Wound Description (Comments):   Present on Admission: Yes        Discharge Instructions  Discharge Instructions     Diet - low sodium heart healthy   Complete by: As directed    Discharge wound care:   Complete by: As directed    Wound care to left arm skin tear:  Cleanse  with NS, cover with folded pieces of xeroform gauze Kellie Simmering # 294). Top with dry gauze and secure with Kerlix roll gauze/paper tape.   Increase activity slowly   Complete by: As directed       Allergies as of 10/12/2019   No Known Allergies       Medication List     STOP taking these medications    atorvastatin 20 MG tablet Commonly known as: LIPITOR   azaTHIOprine 50 MG tablet Commonly known as: IMURAN   lisinopril 20 MG tablet Commonly known as: ZESTRIL       TAKE these medications    acetaminophen 500 MG tablet Commonly known as: TYLENOL Take 500 mg by mouth daily as needed for mild pain (aches.).   alendronate 70 MG tablet Commonly known as: FOSAMAX Take 70 mg by mouth once a week.   DULoxetine 30 MG capsule Commonly known as: CYMBALTA Take 30 mg by mouth daily.   multivitamin tablet Take 1 tablet by mouth daily.   risperiDONE 1 MG tablet Commonly known as: RISPERDAL Take 1-3 mg by mouth 2 (two) times daily. 1mg  am  3mg  hs.   Rocklatan 0.02-0.005 % Soln Generic drug: Netarsudil-Latanoprost Apply 1 drop to eye at bedtime. Right eye.   Simbrinza 1-0.2 % Susp Generic drug: Brinzolamide-Brimonidine Apply 1 drop to eye in the morning, at noon, and at bedtime. Right eye.               Discharge Care Instructions  (From admission, onward)           Start     Ordered   10/12/19 0000  Discharge wound care:       Comments: Wound care to left arm skin tear:  Cleanse with NS, cover with folded pieces of xeroform gauze Kellie Simmering # 294). Top with dry gauze and secure with Kerlix roll gauze/paper tape.   10/12/19 1119            Contact information for after-discharge care     Destination     [facility] Preferred SNF .   Service: Skilled Nursing Contact information: [address] [place] [phone]                    No Known Allergies  Consultations: Palliative care   Procedures/Studies: CT Head Wo Contrast  Result Date: 10/06/2019 CLINICAL DATA:  Altered mental status EXAM: CT HEAD WITHOUT CONTRAST TECHNIQUE: Contiguous axial images were obtained from the base of the skull through the vertex without intravenous  contrast. COMPARISON:  MRI, CT 06/15/2003 FINDINGS: Brain: There is an indeterminate rounded 5 mm hyperdense focus along the lateral left pons. Sequela of remote left pontine lacunar infarct is noted in a similar distribution to comparison MRI. Suspect remote bilateral basal ganglia infarcts. No evidence of acute infarction, hemorrhage, hydrocephalus, extra-axial collection or midline shift. Symmetric prominence of the ventricles, cisterns and sulci compatible with parenchymal volume loss. Patchy areas of white matter hypoattenuation are most compatible with chronic microvascular angiopathy. Vascular: Atherosclerotic calcification of the carotid siphons. No hyperdense vessel. Skull: No calvarial fracture or suspicious osseous lesion. No scalp swelling or hematoma. Sinuses/Orbits: Shrunken hyperdense appearance of the left globe is new since 2005. 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 No acute abnormality of the included orbits. Atherosclerotic calcification of the carotid siphons. No hyperdense vessel. Other: None IMPRESSION: 1. Indeterminate rounded 5 mm hyperdense focus along the lateral left pons. While this may  represent a cavernous malformation, dolichoectasia of a crossing vessel or other indeterminate lesion, incompletely assessed given the amount of streak at the level of the skull base. Could be further visualized with MRI with contrast if patient is able to tolerate. 2. 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 3. Chronic microvascular angiopathy and parenchymal volume loss. 4. Shrunken hyperdense appearance of the left globe is new since 2005. 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 Electronically Signed   By: Lovena Le M.D.   On: 10/06/2019 20:14   MR BRAIN WO CONTRAST  Result Date: 10/07/2019 CLINICAL DATA:  Encephalopathy EXAM: MRI HEAD WITHOUT CONTRAST TECHNIQUE: Multiplanar, multiecho pulse sequences of the brain  and surrounding structures were obtained without intravenous contrast. COMPARISON:  None. FINDINGS: Brain: No acute infarct, acute hemorrhage or extra-axial collection. Early confluent hyperintense T2-weighted signal of the periventricular and deep white matter, most commonly due to chronic ischemic microangiopathy. There is generalized atrophy without lobar predilection. Single focus of chronic microhemorrhage in the left cerebellum. A partially empty sella is incidentally noted. Vascular: Normal flow voids. Skull and upper cervical spine: Normal marrow signal. Sinuses/Orbits: Negative. Other: None IMPRESSION: 1. No acute intracranial abnormality. 2. Generalized atrophy and findings of chronic ischemic microangiopathy. Electronically Signed   By: Ulyses Jarred M.D.   On: 10/07/2019 03:24   DG Chest Port 1 View  Result Date: 10/06/2019 CLINICAL DATA:  Near syncope. EXAM: PORTABLE CHEST 1 VIEW COMPARISON:  None FINDINGS: The lungs are hyperinflated. Mild to moderate severity atelectasis and/or infiltrate is seen within the right lung base. There is a small right pleural effusion. No pneumothorax is identified. The heart size and mediastinal contours are within normal limits. There is marked severity calcification of the aortic arch. Multilevel degenerative changes seen throughout the thoracic spine. IMPRESSION: 1. Mild to moderate severity right basilar atelectasis and/or infiltrate. 2. Small right pleural effusion. Electronically Signed   By: Virgina Norfolk M.D.   On: 10/06/2019 19:23      Subjective: She denies any shortness of breath or pain.  Discharge Exam: Vitals:   10/11/19 0512 10/11/19 1237 10/12/19 0557 10/12/19 1101  BP: (!) 153/76 (!) 142/69 (!) 144/84   Pulse: 61 66 83   Resp: 16 16 18 17   Temp: 97.7 F (36.5 C) 98.2 F (36.8 C) 98.7 F (37.1 C)   TempSrc: Oral Oral    SpO2: 96% 96% 93%   Weight:      Height:        General: Pt is alert, awake, not in acute  distress Cardiovascular: RRR, S1/S2 +, no rubs, no gallops Respiratory: CTA bilaterally, no wheezing, no rhonchi Abdominal: Soft, NT, ND, bowel sounds + Extremities: no edema, no cyanosis    The results of significant diagnostics from this hospitalization (including imaging, microbiology, ancillary and laboratory) are listed below for reference.     Microbiology: Recent Results (from the past 240 hour(s))  Urine Culture     Status: None   Collection Time: 10/06/19  8:57 PM   Specimen: Urine, Random  Result Value Ref Range Status   Specimen Description URINE, RANDOM  Final   Special Requests NONE  Final   Culture   Final    NO GROWTH Performed at [facility], [address] [place], [place] [address]    Report Status 10/08/2019 FINAL  Final  SARS Coronavirus 2 by RT PCR (hospital order, performed in [facility] hospital lab) Nasopharyngeal Nasopharyngeal Swab  Status: None   Collection Time: 10/06/19  9:25 PM   Specimen: Nasopharyngeal Swab  Result Value Ref Range Status   SARS Coronavirus 2 NEGATIVE NEGATIVE Final    Comment: (NOTE) SARS-CoV-2 target nucleic acids are NOT DETECTED.  The SARS-CoV-2 RNA is generally detectable in upper and lower respiratory specimens during the acute phase of infection. The lowest concentration of SARS-CoV-2 viral copies this assay can detect is 250 copies / mL. A negative result does not preclude SARS-CoV-2 infection and should not be used as the sole basis for treatment or other patient management decisions.  A negative result may occur with improper specimen collection / handling, submission of specimen other than nasopharyngeal swab, presence of viral mutation(s) within the areas targeted by this assay, and inadequate number of viral copies (<250 copies / mL). A negative result must be combined with clinical observations, patient history, and epidemiological information.  Fact Sheet for Patients:    StrictlyIdeas.no  Fact Sheet for Healthcare Providers: BankingDealers.co.za  This test is not yet approved or  cleared by the [place] FDA and has been authorized for detection and/or diagnosis of SARS-CoV-2 by FDA under an Emergency Use Authorization (EUA).  This EUA will remain in effect (meaning this test can be used) for the duration of the COVID-19 declaration under Section 564(b)(1) of the Act, 21 U.S.C. section 360bbb-3(b)(1), unless the authorization is terminated or revoked sooner.  Performed at [facility], [place] [address] [place], [place] [address]   Culture, blood (Routine X 2) w Reflex to ID Panel     Status: None   Collection Time: 10/06/19  9:29 PM   Specimen: BLOOD  Result Value Ref Range Status   Specimen Description BLOOD SITE NOT SPECIFIED  Final   Special Requests   Final    BOTTLES DRAWN AEROBIC AND ANAEROBIC Blood Culture results may not be optimal due to an inadequate volume of blood received in culture bottles   Culture   Final    NO GROWTH 5 DAYS Performed at [facility], [place] [address] [place], [place] [address]    Report Status 10/11/2019 FINAL  Final  Culture, blood (Routine X 2) w Reflex to ID Panel     Status: None (Preliminary result)   Collection Time: 10/07/19  4:16 AM   Specimen: BLOOD  Result Value Ref Range Status   Specimen Description BLOOD RIGHT ARM  Final   Special Requests   Final    BOTTLES DRAWN AEROBIC AND ANAEROBIC Blood Culture results may not be optimal due to an excessive volume of blood received in culture bottles PATIENT ON FOLLOWING ROZIPHEN ZITHROMYCIN   Culture   Final    NO GROWTH 4 DAYS Performed at [facility], [place] [address], [place], [place] [address]    Report Status PENDING  Incomplete     Labs: BNP (last 3 results) No results for input(s): BNP in the last 8760 hours. Basic Metabolic Panel: Recent Labs  Lab 10/06/19 1831 10/07/19 0417  10/07/19 0744  NA 141 143 146*  K 3.8 3.0* 3.0*  CL 108 110  --   CO2 24 24  --   GLUCOSE 120* 104*  --   BUN 28* 23  --   CREATININE 0.77 0.64  --   CALCIUM 8.9 8.8*  --    Liver Function Tests: Recent Labs  Lab 10/06/19 1831  AST 23  ALT 13  ALKPHOS 67  BILITOT 0.6  PROT 5.8*  ALBUMIN  2.5*   Recent Labs  Lab 10/06/19 1831  LIPASE 18   Recent Labs  Lab 10/07/19 0417  AMMONIA 10   CBC: Recent Labs  Lab 10/06/19 1831 10/07/19 0417 10/07/19 0742 10/07/19 0744  WBC 14.5* 13.1* 12.5*  --   NEUTROABS 12.7*  --   --   --   HGB 9.0* 8.7* 8.8* 8.8*  HCT 30.3* 29.4* 29.2* 26.0*  MCV 77.3* 78.0* 79.1*  --   PLT 342 356 330  --    Cardiac Enzymes: No results for input(s): CKTOTAL, CKMB, CKMBINDEX, TROPONINI in the last 168 hours. BNP: Invalid input(s): POCBNP CBG: Recent Labs  Lab 10/06/19 1839  GLUCAP 122*   D-Dimer No results for input(s): DDIMER in the last 72 hours. Hgb A1c No results for input(s): HGBA1C in the last 72 hours. Lipid Profile No results for input(s): CHOL, HDL, LDLCALC, TRIG, CHOLHDL, LDLDIRECT in the last 72 hours. Thyroid function studies No results for input(s): TSH, T4TOTAL, T3FREE, THYROIDAB in the last 72 hours.  Invalid input(s): FREET3 Anemia work up No results for input(s): VITAMINB12, FOLATE, FERRITIN, TIBC, IRON, RETICCTPCT in the last 72 hours. Urinalysis    Component Value Date/Time   COLORURINE AMBER (A) 10/06/2019 2056   APPEARANCEUR HAZY (A) 10/06/2019 2056   LABSPEC 1.023 10/06/2019 2056   PHURINE 5.0 10/06/2019 2056   GLUCOSEU NEGATIVE 10/06/2019 2056   HGBUR NEGATIVE 10/06/2019 2056   BILIRUBINUR NEGATIVE 10/06/2019 2056   [place] NEGATIVE 10/06/2019 2056   PROTEINUR NEGATIVE 10/06/2019 2056   NITRITE NEGATIVE 10/06/2019 2056   LEUKOCYTESUR TRACE (A) 10/06/2019 2056   Sepsis Labs Invalid input(s): PROCALCITONIN,  WBC,  LACTICIDVEN Microbiology Recent Results (from the past 240 hour(s))  Urine Culture      Status: None   Collection Time: 10/06/19  8:57 PM   Specimen: Urine, Random  Result Value Ref Range Status   Specimen Description URINE, RANDOM  Final   Special Requests NONE  Final   Culture   Final    NO GROWTH Performed at [facility], [place] [address] [place], [place] [address]    Report Status 10/08/2019 FINAL  Final  SARS Coronavirus 2 by RT PCR (hospital order, performed in [facility] hospital lab) Nasopharyngeal Nasopharyngeal Swab     Status: None   Collection Time: 10/06/19  9:25 PM   Specimen: Nasopharyngeal Swab  Result Value Ref Range Status   SARS Coronavirus 2 NEGATIVE NEGATIVE Final    Comment: (NOTE) SARS-CoV-2 target nucleic acids are NOT DETECTED.  The SARS-CoV-2 RNA is generally detectable in upper and lower respiratory specimens during the acute phase of infection. The lowest concentration of SARS-CoV-2 viral copies this assay can detect is 250 copies / mL. A negative result does not preclude SARS-CoV-2 infection and should not be used as the sole basis for treatment or other patient management decisions.  A negative result may occur with improper specimen collection / handling, submission of specimen other than nasopharyngeal swab, presence of viral mutation(s) within the areas targeted by this assay, and inadequate number of viral copies (<250 copies / mL). A negative result must be combined with clinical observations, patient history, and epidemiological information.  Fact Sheet for Patients:   StrictlyIdeas.no  Fact Sheet for Healthcare Providers: BankingDealers.co.za  This test is not yet approved or  cleared by the [place] FDA and has been authorized for detection and/or diagnosis of SARS-CoV-2 by FDA under an Emergency Use Authorization (EUA).  This EUA will remain in effect (  meaning this test can be used) for the duration of the COVID-19 declaration under Section 564(b)(1) of the Act, 21  U.S.C. section 360bbb-3(b)(1), unless the authorization is terminated or revoked sooner.  Performed at [facility], [place] [address], [place], [place] [address]   Culture, blood (Routine X 2) w Reflex to ID Panel     Status: None   Collection Time: 10/06/19  9:29 PM   Specimen: BLOOD  Result Value Ref Range Status   Specimen Description BLOOD SITE NOT SPECIFIED  Final   Special Requests   Final    BOTTLES DRAWN AEROBIC AND ANAEROBIC Blood Culture results may not be optimal due to an inadequate volume of blood received in culture bottles   Culture   Final    NO GROWTH 5 DAYS Performed at [facility], [place] [address] [place], [place] [address]    Report Status 10/11/2019 FINAL  Final  Culture, blood (Routine X 2) w Reflex to ID Panel     Status: None (Preliminary result)   Collection Time: 10/07/19  4:16 AM   Specimen: BLOOD  Result Value Ref Range Status   Specimen Description BLOOD RIGHT ARM  Final   Special Requests   Final    BOTTLES DRAWN AEROBIC AND ANAEROBIC Blood Culture results may not be optimal due to an excessive volume of blood received in culture bottles PATIENT ON FOLLOWING ROZIPHEN ZITHROMYCIN   Culture   Final    NO GROWTH 4 DAYS Performed at [facility], [place] [address] [place], [place] [address]    Report Status PENDING  Incomplete     Time coordinating discharge: 62mins  SIGNED:   Kathie Dike, MD  [facility] 10/12/2019, 11:21 AM   If 7PM-7AM, please contact night-coverage [email or web address]

## 2019-10-12 NOTE — Progress Notes (Signed)
Report called to [facility] at [facility].  All questions answered.  Awaiting PTAR

## 2019-10-15 ENCOUNTER — Non-Acute Institutional Stay (SKILLED_NURSING_FACILITY): Payer: Medicare Other | Admitting: Internal Medicine

## 2019-10-15 ENCOUNTER — Encounter: Payer: Self-pay | Admitting: Internal Medicine

## 2019-10-15 DIAGNOSIS — D509 Iron deficiency anemia, unspecified: Secondary | ICD-10-CM | POA: Diagnosis not present

## 2019-10-15 DIAGNOSIS — J690 Pneumonitis due to inhalation of food and vomit: Secondary | ICD-10-CM

## 2019-10-15 DIAGNOSIS — F05 Delirium due to known physiological condition: Secondary | ICD-10-CM

## 2019-10-15 DIAGNOSIS — R627 Adult failure to thrive: Secondary | ICD-10-CM

## 2019-10-15 DIAGNOSIS — I10 Essential (primary) hypertension: Secondary | ICD-10-CM | POA: Diagnosis not present

## 2019-10-15 DIAGNOSIS — L8990 Pressure ulcer of unspecified site, unspecified stage: Secondary | ICD-10-CM | POA: Diagnosis not present

## 2019-10-15 DIAGNOSIS — R54 Age-related physical debility: Secondary | ICD-10-CM | POA: Diagnosis not present

## 2019-10-15 DIAGNOSIS — R1312 Dysphagia, oropharyngeal phase: Secondary | ICD-10-CM | POA: Diagnosis not present

## 2019-10-15 DIAGNOSIS — F0280 Dementia in other diseases classified elsewhere without behavioral disturbance: Secondary | ICD-10-CM | POA: Diagnosis not present

## 2019-10-15 DIAGNOSIS — G301 Alzheimer's disease with late onset: Secondary | ICD-10-CM | POA: Insufficient documentation

## 2019-10-15 DIAGNOSIS — F0282 Dementia in other diseases classified elsewhere, unspecified severity, with psychotic disturbance: Secondary | ICD-10-CM

## 2019-10-15 NOTE — Progress Notes (Signed)
Provider:  Rexene Edison. Mariea Clonts, D.O., C.M.D. Location:  [place] Room Number: [place] of Service:  SNF (31)  PCP: Maury Dus, MD Patient Care Team: Maury Dus, MD as PCP - General (Family Medicine)  Extended Emergency Contact Information Primary Emergency Contact: Richarda Osmond,  Home Phone: [phone] Relation: None  Code Status: DNR Goals of Care: Advanced Directive information Advanced Directives 10/15/2019  Does Patient Have a Medical Advance Directive? Yes  Type of Advance Directive Out of facility DNR (pink MOST or yellow form)  Does patient want to make changes to medical advance directive? No - Patient declined  Would patient like information on creating a medical advance directive? -    Chief Complaint  Patient presents with  . New Admit To SNF    New admit to SNF     HPI: Patient is a 86 y.o. female seen today for admission to [facility] status post hospitalization at [facility] from July 7-13th for increasing lethargy weakness and confusion.  Notes indicate she lives at home with her daughter.  At baseline, she ambulates with help of a walker which she was using less in the prior 2 months and staying in bed.  She has a history of dementia, hypertension, anemia, blindness, and hearing loss.  She had become increasingly lethargic and her functional status had declined with some fecal incontinence over the past 2 weeks.  She had an overall decline over 2 to 3 months with less mobility.  At times she has had dark loose stools.  Has had no nausea or vomiting.    In the emergency room she was found to be afebrile but did have leukocytosis of 14.5 and hemoglobin of 9.  Baseline appeared to be around 11.  CT of her head and MRI of her brain were unremarkable UA did suggest UTI and chest x-ray showed possible infiltrates.  She was very drowsy but was able to fall follow commands in the emergency room to  move her extremities.  She was started on empiric antibiotics and admitted for further work-up delirium. Covid testing was negative.  She has not been vaccinated.  Stool was checked for occult blood and anemia panel was performed.  He was kept n.p.o. and given IV fluids.  Swallow evaluation was ordered.  Speech therapy was ordered.    Palliative care was consulted during the stay and helped guide the family through discussions of goals of care.  They decided to discharge the patient to skilled nursing facility with palliative care following she did not make meaningful improvements the plan is to transition to hospice care.  For her blood pressure she was started on as needed hydralazine and her home dose of lisinopril was discontinued due to her poor p.o. intake.  Blood pressure was ranging in the 140s which was felt to be acceptable.  Anemia work-up did show low iron but given side effects of oral iron it was decided not to add that as an additional supplement to her regimen.  Her urine culture did not grow out any bacteria so ceftriaxone and azithromycin and she was initially given were discontinued.  She had been started on Unasyn and that was stopped as well.    There was concern for aspiration pneumonia with right lower lobe infiltrate on admission hence the Unasyn.  She completed 5 days of IV antibiotics which should adequately treat pneumonia so these  were discontinued at discharge she had no fever, shortness of breath or cough.  She did develop a pressure injury of her skin.  She has had a skin tear to her left arm was cleansed with saline covered with Xerofoam talked with dry gauze and wrapped with Kerlix.  When seen today, she responded to me, but did not speak many words.  She was able to move her arms and legs.  She was very HOH.    Past Medical History:  Diagnosis Date  . Dementia ([place])   . Hypertension    Past Surgical History:  Procedure Laterality Date  . ANKLE SURGERY       Social History   Socioeconomic History  . Marital status: Widowed    Spouse name: Not on file  . Number of children: Not on file  . Years of education: Not on file  . Highest education level: Not on file  Occupational History  . Not on file  Tobacco Use  . Smoking status: Former Research scientist (life sciences)  . Smokeless tobacco: Never Used  Substance and Sexual Activity  . Alcohol use: Not Currently  . Drug use: Never  . Sexual activity: Not on file  Other Topics Concern  . Not on file  Social History Narrative  . Not on file   Social Determinants of Health   Financial Resource Strain:   . Difficulty of Paying Living Expenses:   Food Insecurity:   . Worried About Charity fundraiser in the Last Year:   . Arboriculturist in the Last Year:   Transportation Needs:   . Film/video editor (Medical):   Marland Kitchen Lack of Transportation (Non-Medical):   Physical Activity:   . Days of Exercise per Week:   . Minutes of Exercise per Session:   Stress:   . Feeling of Stress :   Social Connections:   . Frequency of Communication with Friends and Family:   . Frequency of Social Gatherings with Friends and Family:   . Attends Religious Services:   . Active Member of Clubs or Organizations:   . Attends Archivist Meetings:   Marland Kitchen Marital Status:     reports that she has quit smoking. She has never used smokeless tobacco. She reports previous alcohol use. She reports that she does not use drugs.  Functional Status Survey:  dependent at this point  Family History  Problem Relation Age of Onset  . Dementia Maternal Aunt     Health Maintenance  Topic Date Due  . COVID-19 Vaccine (1) Never done  . TETANUS/TDAP  Never done  . DEXA SCAN  Never done  . INFLUENZA VACCINE  10/31/2019  . PNA vac Low Risk Adult (2 of 2 - PCV13) 11/13/2019    No Known Allergies  Outpatient Encounter Medications as of 10/15/2019  Medication Sig  . acetaminophen (TYLENOL) 500 MG tablet Take 500 mg by mouth  daily as needed for mild pain (aches.).  Marland Kitchen alendronate (FOSAMAX) 70 MG tablet Take 70 mg by mouth once a week.  . Brinzolamide-Brimonidine (SIMBRINZA) 1-0.2 % SUSP Apply 1 drop to eye in the morning, at noon, and at bedtime. Right eye.  . DULoxetine (CYMBALTA) 30 MG capsule Take 30 mg by mouth daily.  . Multiple Vitamin (MULTIVITAMIN) tablet Take 1 tablet by mouth daily.  . Netarsudil-Latanoprost (ROCKLATAN) 0.02-0.005 % SOLN Apply 1 drop to eye at bedtime. Right eye.  . risperiDONE (RISPERDAL) 1 MG tablet Take 1-3 mg by mouth 2 (two) times  daily. 1mg  am  3mg  hs.   No facility-administered encounter medications on file as of 10/15/2019.    Review of Systems  Unable to perform ROS: Dementia (and hearing loss, poor sight)    Vitals:   10/15/19 1046  BP: 131/80  Pulse: 76  Temp: (!) 97 F (36.1 C)  SpO2: 95%  Weight: 126 lb (57.2 kg)  Height: 5\' 5"  (1.651 m)   Body mass index is 20.97 kg/m. Physical Exam Vitals and nursing note reviewed.  Constitutional:      General: She is not in acute distress.    Appearance: She is ill-appearing.     Comments: Frail female resting in bed  HENT:     Head: Normocephalic and atraumatic.     Right Ear: External ear normal.     Left Ear: External ear normal.     Ears:     Comments: HOH    Nose: Nose normal.     Mouth/Throat:     Pharynx: Oropharynx is clear.  Eyes:     General: No scleral icterus.    Comments: Left eye only partially opens  Cardiovascular:     Rate and Rhythm: Normal rate and regular rhythm.     Pulses: Normal pulses.     Heart sounds: Normal heart sounds. No murmur heard.   Pulmonary:     Effort: Pulmonary effort is normal.     Breath sounds: Rhonchi present.  Abdominal:     General: Bowel sounds are normal.     Palpations: Abdomen is soft.     Tenderness: There is no abdominal tenderness.  Musculoskeletal:        General: Normal range of motion.     Right lower leg: No edema.     Left lower leg: No edema.   Lymphadenopathy:     Cervical: No cervical adenopathy.  Skin:    General: Skin is warm and dry.     Comments: Pressure injury to buttock--not able to be viewed by me  Neurological:     Motor: Weakness present.     Comments: Lethargic, but did wake up to voice and answered some of my questions when audible  Psychiatric:     Comments: Could not assess     Labs reviewed: Basic Metabolic Panel: Recent Labs    10/06/19 1831 10/07/19 0417 10/07/19 0744  NA 141 143 146*  K 3.8 3.0* 3.0*  CL 108 110  --   CO2 24 24  --   GLUCOSE 120* 104*  --   BUN 28* 23  --   CREATININE 0.77 0.64  --   CALCIUM 8.9 8.8*  --    Liver Function Tests: Recent Labs    10/06/19 1831  AST 23  ALT 13  ALKPHOS 67  BILITOT 0.6  PROT 5.8*  ALBUMIN 2.5*   Recent Labs    10/06/19 1831  LIPASE 18   Recent Labs    10/07/19 0417  AMMONIA 10   CBC: Recent Labs    10/06/19 1831 10/06/19 1831 10/07/19 0417 10/07/19 0742 10/07/19 0744  WBC 14.5*  --  13.1* 12.5*  --   NEUTROABS 12.7*  --   --   --   --   HGB 9.0*   < > 8.7* 8.8* 8.8*  HCT 30.3*   < > 29.4* 29.2* 26.0*  MCV 77.3*  --  78.0* 79.1*  --   PLT 342  --  356 330  --    < > =  values in this interval not displayed.   Cardiac Enzymes: No results for input(s): CKTOTAL, CKMB, CKMBINDEX, TROPONINI in the last 8760 hours. BNP: Invalid input(s): POCBNP No results found for: HGBA1C Lab Results  Component Value Date   TSH 1.814 10/07/2019   Lab Results  Component Value Date   VITAMINB12 422 10/07/2019   Lab Results  Component Value Date   FOLATE 33.1 10/07/2019   Lab Results  Component Value Date   IRON 11 (L) 10/07/2019   TIBC 330 10/07/2019   FERRITIN 32 10/07/2019    Imaging and Procedures obtained prior to SNF admission: CT Head Wo Contrast  Result Date: 10/06/2019 CLINICAL DATA:  Altered mental status EXAM: CT HEAD WITHOUT CONTRAST TECHNIQUE: Contiguous axial images were obtained from the base of the skull  through the vertex without intravenous contrast. COMPARISON:  MRI, CT 06/15/2003 FINDINGS: Brain: There is an indeterminate rounded 5 mm hyperdense focus along the lateral left pons. Sequela of remote left pontine lacunar infarct is noted in a similar distribution to comparison MRI. Suspect remote bilateral basal ganglia infarcts. No evidence of acute infarction, hemorrhage, hydrocephalus, extra-axial collection or midline shift. Symmetric prominence of the ventricles, cisterns and sulci compatible with parenchymal volume loss. Patchy areas of white matter hypoattenuation are most compatible with chronic microvascular angiopathy. Vascular: Atherosclerotic calcification of the carotid siphons. No hyperdense vessel. Skull: No calvarial fracture or suspicious osseous lesion. No scalp swelling or hematoma. Sinuses/Orbits: Shrunken hyperdense appearance of the left globe is new since 2005. 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 No acute abnormality of the included orbits. Atherosclerotic calcification of the carotid siphons. No hyperdense vessel. Other: None IMPRESSION: 1. Indeterminate rounded 5 mm hyperdense focus along the lateral left pons. While this may represent a cavernous malformation, dolichoectasia of a crossing vessel or other indeterminate lesion, incompletely assessed given the amount of streak at the level of the skull base. Could be further visualized with MRI with contrast if patient is able to tolerate. 2. 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 3. Chronic microvascular angiopathy and parenchymal volume loss. 4. Shrunken hyperdense appearance of the left globe is new since 2005. 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 Electronically Signed   By: Lovena Le M.D.   On: 10/06/2019 20:14   MR BRAIN WO CONTRAST  Result Date: 10/07/2019 CLINICAL DATA:  Encephalopathy EXAM: MRI HEAD WITHOUT CONTRAST TECHNIQUE: Multiplanar,  multiecho pulse sequences of the brain and surrounding structures were obtained without intravenous contrast. COMPARISON:  None. FINDINGS: Brain: No acute infarct, acute hemorrhage or extra-axial collection. Early confluent hyperintense T2-weighted signal of the periventricular and deep white matter, most commonly due to chronic ischemic microangiopathy. There is generalized atrophy without lobar predilection. Single focus of chronic microhemorrhage in the left cerebellum. A partially empty sella is incidentally noted. Vascular: Normal flow voids. Skull and upper cervical spine: Normal marrow signal. Sinuses/Orbits: Negative. Other: None IMPRESSION: 1. No acute intracranial abnormality. 2. Generalized atrophy and findings of chronic ischemic microangiopathy. Electronically Signed   By: Ulyses Jarred M.D.   On: 10/07/2019 03:24   DG Chest Port 1 View  Result Date: 10/06/2019 CLINICAL DATA:  Near syncope. EXAM: PORTABLE CHEST 1 VIEW COMPARISON:  None FINDINGS: The lungs are hyperinflated. Mild to moderate severity atelectasis and/or infiltrate is seen within the right lung base. There is a small right pleural effusion. No pneumothorax is identified. The heart size and mediastinal contours are within normal limits. There is marked severity  calcification of the aortic arch. Multilevel degenerative changes seen throughout the thoracic spine. IMPRESSION: 1. Mild to moderate severity right basilar atelectasis and/or infiltrate. 2. Small right pleural effusion. Electronically Signed   By: Virgina Norfolk M.D.   On: 10/06/2019 19:23    Assessment/Plan 1. Aspiration pneumonia of right lower lobe due to gastric secretions (HCC) -completed abx -ST here and aspiration precautions in place -unfortunately is likely to occur due to persistent dysphagia and advanced dementia  2. Oropharyngeal dysphagia -cont aspiration precautions and modification of diet per ST plus assistance as needed with meals and  supplements -d/c fosamax   3. Primary degenerative dementia of the Alzheimer type, senile onset, with delirium ([place]) -appears to be at fast 6-7 now -palliative care to follow here -if she does not make progress, she would be eligible for hospice care  4. Failure to thrive in adult -ongoing, is frail and has a pressure injury -several meds were stopped at hospital including imuran, lipitor and lisinopril, will also stop fosamax due to prolonged times in bed putting her at more risk for esophagitis -was not ambulating much at home anyway for several weeks  5. Iron deficiency anemia, unspecified iron deficiency anemia type -not on iron, monitor  6. Frailty syndrome in geriatric patient -certainly the case -encourage po as tolerates and offer supplements  -assist with meals -monitor for any therapy progress  7. Pressure injury of skin, unspecified injury stage, unspecified location -continue dressings and regular movement per wound care team  8. Benign essential hypertension -not on meds anymore  Family/ staff Communication: discussed with nursing  Labs/tests ordered:  No new added  Anael Rosch L. Suhan Paci, D.O. [facility] [address],  [address] Cell Phone (Mon-Fri 8am-5pm):  [phone] On Call:  [phone] & follow prompts after 5pm & weekends Office Phone:  [phone] Office Fax:  [phone]

## 2019-10-17 NOTE — Addendum Note (Signed)
Addended by: Gayland Curry on: 10/17/2019 03:51 PM   Modules accepted: Orders

## 2019-10-18 LAB — BASIC METABOLIC PANEL
BUN: 8 (ref 4–21)
CO2: 25 — AB (ref 13–22)
Chloride: 104 (ref 99–108)
Creatinine: 0.3 — AB (ref 0.5–1.1)
Glucose: 87
Potassium: 3.6 (ref 3.4–5.3)
Sodium: 139 (ref 137–147)

## 2019-10-18 LAB — CBC AND DIFFERENTIAL
Hemoglobin: 10.5 — AB (ref 12.0–16.0)
Platelets: 282 (ref 150–399)
WBC: 6.8

## 2019-10-18 LAB — CBC: RBC: 4.32 (ref 3.87–5.11)

## 2019-10-18 LAB — COMPREHENSIVE METABOLIC PANEL: Calcium: 9.1 (ref 8.7–10.7)

## 2019-10-22 ENCOUNTER — Other Ambulatory Visit: Payer: Self-pay

## 2019-10-22 ENCOUNTER — Non-Acute Institutional Stay: Payer: Medicare Other

## 2019-10-22 DIAGNOSIS — Z515 Encounter for palliative care: Secondary | ICD-10-CM

## 2019-10-26 ENCOUNTER — Other Ambulatory Visit: Payer: Self-pay | Admitting: *Deleted

## 2019-10-26 NOTE — Progress Notes (Signed)
COMMUNITY PALLIATIVE CARE SW NOTE  PATIENT NAME: Autumn Wells DOB: [birth year] MRN: [id number]  PRIMARY CARE PROVIDER: Maury Dus, MD  RESPONSIBLE PARTY:  Acct ID - Guarantor Home Phone Work Phone Relationship Acct Type  [id number] Autumn Wells, Autumn Wells [phone]  Self P/F     [address], [place], [place] [id number]     PLAN OF CARE and INTERVENTIONS:             1. GOALS OF CARE/ ADVANCE CARE PLANNING:  Goal is for patient to return home following rehabilitation. Patient is a DNR.  2. SOCIAL/EMOTIONAL/SPIRITUAL ASSESSMENT/ INTERVENTIONS:  SW completed a face-to-face visit with patient at the facility [facility]). Patient was alert and oriented to self only. She was pleasantly confused with scattered dialogue. Patient was not able to give any information on her social history or current status. Patient's breakfast was still present. SW assisted her and she only took a few bites and had some coughing. She is thin and frail in appearance. Patient is currently receiving PT and OT. SW consulted with the facility RN-Mo who report that patient's overall condition is stable. Her intake is usually 25-50% of meals. She has dementia and is unable to follow any commands.Patient is dependent for all ADL's. Patient's daughter is her PCG and a SW left a message for her updating her on the visit and requesting a call back. SW provided supportive presence, meal assistance, consult with staff, observation, assessment of needs and comfort. SW to follow-up with patient's daughter to extend support to her and assess needs/coping, ongoing palliative visits with patient to assess psychosocial needs and provide support to her, ongoing care coordination and consultation with facility staff.  3. PATIENT/CAREGIVER EDUCATION/ COPING:  Patient is alert and oriented to self only. No anxiety or coping issues noted.  4. PERSONAL EMERGENCY PLAN:  Per facility protocol 5. COMMUNITY RESOURCES COORDINATION/ HEALTH CARE NAVIGATION:  Patient is currently receiving PT and OT 6. FINANCIAL/LEGAL CONCERNS/INTERVENTIONS:  No financial or legal concerns.      SOCIAL HX:  Social History   Tobacco Use  . Smoking status: Former Research scientist (life sciences)  . Smokeless tobacco: Never Used  Substance Use Topics  . Alcohol use: Not Currently    CODE STATUS: DNR ADVANCED DIRECTIVES: No MOST FORM COMPLETE:  No HOSPICE EDUCATION PROVIDED: No  PPS: Patient is dependent for all ADL's. She has advanced dementia and is alert and oriented to self only. Patient is currently receiving PT & OT.  Duration of visit and documentation: 60 minutes        Katheren Puller, LCSW

## 2019-10-26 NOTE — Patient Outreach (Signed)
Member screened for potential [facility] Care Management needs as a benefit of [place] [employer].  Verified in Patient Autumn Wells that [name] is receiving skilled therapy at [facility].   Spoke with facility SW department. [facility] palliative to follow.  Per hospital records [name] has medical history of adult failure to thrive, encephalopathy, HTN, anemia, UTI, pneumonia.  Will continue to follow for transition plans and for potential [facility] Care Management needs. Will collaborate with facility and family as needed.    Marthenia Rolling, MSN-Ed, RN,BSN [place] Acute Care Coordinator [phone] [facility]) [phone]  (Toll free office)

## 2019-10-28 ENCOUNTER — Non-Acute Institutional Stay (SKILLED_NURSING_FACILITY): Payer: Medicare Other | Admitting: Family

## 2019-10-28 ENCOUNTER — Encounter: Payer: Self-pay | Admitting: Family

## 2019-10-28 DIAGNOSIS — I10 Essential (primary) hypertension: Secondary | ICD-10-CM | POA: Diagnosis not present

## 2019-10-28 DIAGNOSIS — G301 Alzheimer's disease with late onset: Secondary | ICD-10-CM | POA: Diagnosis not present

## 2019-10-28 DIAGNOSIS — F0280 Dementia in other diseases classified elsewhere without behavioral disturbance: Secondary | ICD-10-CM

## 2019-10-28 DIAGNOSIS — R627 Adult failure to thrive: Secondary | ICD-10-CM | POA: Diagnosis not present

## 2019-10-28 DIAGNOSIS — F0282 Dementia in other diseases classified elsewhere, unspecified severity, with psychotic disturbance: Secondary | ICD-10-CM

## 2019-10-28 DIAGNOSIS — F05 Delirium due to known physiological condition: Secondary | ICD-10-CM | POA: Diagnosis not present

## 2019-10-28 DIAGNOSIS — R600 Localized edema: Secondary | ICD-10-CM | POA: Diagnosis not present

## 2019-10-28 DIAGNOSIS — F331 Major depressive disorder, recurrent, moderate: Secondary | ICD-10-CM

## 2019-10-28 NOTE — Progress Notes (Signed)
Location:    [facility]   [facility] Room Number: 101-P Place of Service:  SNF (31) Provider:  Marlowe Sax, NP   Patient Care Team: Maury Dus, MD as PCP - General (Family Medicine)  Extended Emergency Contact Information Primary Emergency Contact: Richarda Osmond,  Home Phone: [phone] Relation: None  Code Status:  DNR Goals of care: Advanced Directive information Advanced Directives 10/28/2019  Does Patient Have a Medical Advance Directive? Yes  Type of Advance Directive Out of facility DNR (pink MOST or yellow form)  Does patient want to make changes to medical advance directive? No - Patient declined  Would patient like information on creating a medical advance directive? -     Chief Complaint  Patient presents with  . Acute Visit    Review Medications.     HPI:  Pt is a 86 y.o. female seen today for an acute visit for medication review per pharmacy review to evaluate need for Risperdal  and Cymbalta.she is seen in her room sitting up on recliner.she answer question but confused asking for her daughter to take her room.she is here for short term rehab post hospitalization 10/12/2019 for aspiration pneumonia.Facility Nurse states patient stable on Risperdal  and Cymbalta gradual reduction of dosage will worsen patient's behaviors.   Past Medical History:  Diagnosis Date  . Dementia ([place])   . Hypertension    Past Surgical History:  Procedure Laterality Date  . ANKLE SURGERY      No Known Allergies  Allergies as of 10/28/2019   No Known Allergies     Medication List       Accurate as of October 28, 2019 10:57 AM. If you have any questions, ask your nurse or doctor.        acetaminophen 500 MG tablet Commonly known as: TYLENOL Take 500 mg by mouth daily as needed for mild pain (aches.).   alendronate 70 MG tablet Commonly known as: FOSAMAX Take 70 mg by mouth once a week. Take with a full glass of water on an  empty stomach on Mondays.   DULoxetine 30 MG capsule Commonly known as: CYMBALTA Take 30 mg by mouth daily.   multivitamin tablet Take 1 tablet by mouth daily.   risperiDONE 3 MG tablet Commonly known as: RISPERDAL Take 3 mg by mouth at bedtime.   risperiDONE 1 MG tablet Commonly known as: RISPERDAL Take 1 mg by mouth in the morning.   Rocklatan 0.02-0.005 % Soln Generic drug: Netarsudil-Latanoprost Apply 1 drop to eye at bedtime. Right eye.   Simbrinza 1-0.2 % Susp Generic drug: Brinzolamide-Brimonidine Apply 1 drop to eye in the morning, at noon, and at bedtime. Right eye.       Review of Systems  Unable to perform ROS: Dementia (additional information provided by facility Nurse )  Constitutional: Negative for appetite change, chills, fatigue and fever.  HENT: Negative for congestion, rhinorrhea, sinus pressure, sinus pain, sneezing and sore throat.   Eyes: Negative for discharge, redness and itching.  Respiratory: Negative for cough, chest tightness and wheezing.   Cardiovascular: Positive for leg swelling. Negative for chest pain and palpitations.  Gastrointestinal: Negative for abdominal distention, abdominal pain, constipation, diarrhea, nausea and vomiting.  Musculoskeletal: Positive for gait problem. Negative for arthralgias, back pain and joint swelling.  Skin: Negative for color change, pallor and rash.  Neurological: Negative for dizziness, speech difficulty, light-headedness and headaches.  Psychiatric/Behavioral: Positive for confusion. Negative  for agitation, behavioral problems and sleep disturbance. The patient is not nervous/anxious.     Immunization History  Administered Date(s) Administered  . Pneumococcal Polysaccharide-23 11/13/2018   Pertinent  Health Maintenance Due  Topic Date Due  . DEXA SCAN  Never done  . INFLUENZA VACCINE  10/31/2019  . PNA vac Low Risk Adult (2 of 2 - PCV13) 11/13/2019   Fall Risk  10/17/2019  Falls in the past year? 1    Number falls in past yr: 1  Injury with Fall? 1  Risk for fall due to : History of fall(s);Impaired balance/gait;Impaired vision;Impaired mobility;Mental status change;Medication side effect  Follow up Falls evaluation completed;Education provided;Falls prevention discussed    Vitals:   10/28/19 1027  BP: (!) 146/80  Pulse: 63  Resp: 17  Temp: 98.4 F (36.9 C)  SpO2: 95%  Weight: 126 lb (57.2 kg)  Height: 5\' 5"  (1.651 m)   Body mass index is 20.97 kg/m. Physical Exam Vitals reviewed.  Constitutional:      General: She is not in acute distress.    Appearance: She is normal weight. She is not ill-appearing.     Comments: Frail   HENT:     Head: Normocephalic.  Eyes:     Comments: Right eye pupil round and reactive to light.Left eye partial opens.   Cardiovascular:     Rate and Rhythm: Normal rate and regular rhythm.     Pulses: Normal pulses.     Heart sounds: Normal heart sounds. No murmur heard.  No friction rub. No gallop.   Pulmonary:     Effort: Pulmonary effort is normal. No respiratory distress.     Breath sounds: Normal breath sounds. No wheezing, rhonchi or rales.  Chest:     Chest wall: No tenderness.  Abdominal:     General: Bowel sounds are normal. There is no distension.     Palpations: Abdomen is soft. There is no mass.     Tenderness: There is no abdominal tenderness. There is no right CVA tenderness, left CVA tenderness, guarding or rebound.  Musculoskeletal:        General: No swelling or tenderness.     Comments: Moves x 4 extremities.bilateral trace edema.   Skin:    General: Skin is warm.     Coloration: Skin is not pale.     Findings: No bruising, erythema or rash.     Comments: PU not visualize examined sitting on recliner.   Neurological:     Mental Status: She is alert. Mental status is at baseline.     Cranial Nerves: No cranial nerve deficit.     Motor: No weakness.     Gait: Gait abnormal.     Labs reviewed: Recent Labs     10/06/19 1831 10/06/19 1831 10/07/19 0417 10/07/19 0744 10/18/19 0000  NA 141   < > 143 146* 139  K 3.8   < > 3.0* 3.0* 3.6  CL 108  --  110  --  104  CO2 24  --  24  --  25*  GLUCOSE 120*  --  104*  --   --   BUN 28*  --  23  --  8  CREATININE 0.77  --  0.64  --  0.3*  CALCIUM 8.9  --  8.8*  --  9.1   < > = values in this interval not displayed.   Recent Labs    10/06/19 1831  AST 23  ALT 13  ALKPHOS  67  BILITOT 0.6  PROT 5.8*  ALBUMIN 2.5*   Recent Labs    10/06/19 1831 10/06/19 1831 10/07/19 0417 10/07/19 0417 10/07/19 0742 10/07/19 0744 10/18/19 0000  WBC 14.5*   < > 13.1*  --  12.5*  --  6.8  NEUTROABS 12.7*  --   --   --   --   --   --   HGB 9.0*   < > 8.7*   < > 8.8* 8.8* 10.5*  HCT 30.3*   < > 29.4*  --  29.2* 26.0*  --   MCV 77.3*  --  78.0*  --  79.1*  --   --   PLT 342   < > 356  --  330  --  282   < > = values in this interval not displayed.   Lab Results  Component Value Date   TSH 1.814 10/07/2019    Significant Diagnostic Results in last 30 days:  CT Head Wo Contrast  Result Date: 10/06/2019 CLINICAL DATA:  Altered mental status EXAM: CT HEAD WITHOUT CONTRAST TECHNIQUE: Contiguous axial images were obtained from the base of the skull through the vertex without intravenous contrast. COMPARISON:  MRI, CT 06/15/2003 FINDINGS: Brain: There is an indeterminate rounded 5 mm hyperdense focus along the lateral left pons. Sequela of remote left pontine lacunar infarct is noted in a similar distribution to comparison MRI. Suspect remote bilateral basal ganglia infarcts. No evidence of acute infarction, hemorrhage, hydrocephalus, extra-axial collection or midline shift. Symmetric prominence of the ventricles, cisterns and sulci compatible with parenchymal volume loss. Patchy areas of white matter hypoattenuation are most compatible with chronic microvascular angiopathy. Vascular: Atherosclerotic calcification of the carotid siphons. No hyperdense vessel. Skull:  No calvarial fracture or suspicious osseous lesion. No scalp swelling or hematoma. Sinuses/Orbits: Shrunken hyperdense appearance of the left globe is new since 2005. 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 No acute abnormality of the included orbits. Atherosclerotic calcification of the carotid siphons. No hyperdense vessel. Other: None IMPRESSION: 1. Indeterminate rounded 5 mm hyperdense focus along the lateral left pons. While this may represent a cavernous malformation, dolichoectasia of a crossing vessel or other indeterminate lesion, incompletely assessed given the amount of streak at the level of the skull base. Could be further visualized with MRI with contrast if patient is able to tolerate. 2. 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 3. Chronic microvascular angiopathy and parenchymal volume loss. 4. Shrunken hyperdense appearance of the left globe is new since 2005. 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 Electronically Signed   By: Lovena Le M.D.   On: 10/06/2019 20:14   MR BRAIN WO CONTRAST  Result Date: 10/07/2019 CLINICAL DATA:  Encephalopathy EXAM: MRI HEAD WITHOUT CONTRAST TECHNIQUE: Multiplanar, multiecho pulse sequences of the brain and surrounding structures were obtained without intravenous contrast. COMPARISON:  None. FINDINGS: Brain: No acute infarct, acute hemorrhage or extra-axial collection. Early confluent hyperintense T2-weighted signal of the periventricular and deep white matter, most commonly due to chronic ischemic microangiopathy. There is generalized atrophy without lobar predilection. Single focus of chronic microhemorrhage in the left cerebellum. A partially empty sella is incidentally noted. Vascular: Normal flow voids. Skull and upper cervical spine: Normal marrow signal. Sinuses/Orbits: Negative. Other: None IMPRESSION: 1. No acute intracranial abnormality. 2. Generalized atrophy and findings  of chronic ischemic microangiopathy. Electronically Signed   By: Ulyses Jarred M.D.   On: 10/07/2019 03:24   DG Chest [facility]  1 View  Result Date: 10/06/2019 CLINICAL DATA:  Near syncope. EXAM: PORTABLE CHEST 1 VIEW COMPARISON:  None FINDINGS: The lungs are hyperinflated. Mild to moderate severity atelectasis and/or infiltrate is seen within the right lung base. There is a small right pleural effusion. No pneumothorax is identified. The heart size and mediastinal contours are within normal limits. There is marked severity calcification of the aortic arch. Multilevel degenerative changes seen throughout the thoracic spine. IMPRESSION: 1. Mild to moderate severity right basilar atelectasis and/or infiltrate. 2. Small right pleural effusion. Electronically Signed   By: Virgina Norfolk M.D.   On: 10/06/2019 19:23    Assessment/Plan 1. Benign essential hypertension B/p log reviewed stable.  2. Failure to thrive in adult Frail.Continue to encourage oral intake.  3. Primary degenerative dementia of the Alzheimer type, senile onset, with delirium ([place]) No new behavioral issues.confused but stable on Risperdal no gradual dose reduction to prevent worsening psychosis.   4. Moderate episode of recurrent major depressive disorder (HCC) Mood stable.continue on Duloxetine  5. Edema of both lower extremities Bilateral lower extremities trace edema.Encourage to keep legs elevated when seated.bilateral lungs clear no shortness of breath or wheezing.    Family/ staff Communication: Reviewed plan of care with patient and facility Nurse.   Labs/tests ordered:  None

## 2019-11-03 ENCOUNTER — Non-Acute Institutional Stay (SKILLED_NURSING_FACILITY): Payer: Medicare Other | Admitting: Family

## 2019-11-03 ENCOUNTER — Encounter: Payer: Self-pay | Admitting: Family

## 2019-11-03 DIAGNOSIS — R1312 Dysphagia, oropharyngeal phase: Secondary | ICD-10-CM | POA: Diagnosis not present

## 2019-11-03 DIAGNOSIS — M810 Age-related osteoporosis without current pathological fracture: Secondary | ICD-10-CM

## 2019-11-03 DIAGNOSIS — F0280 Dementia in other diseases classified elsewhere without behavioral disturbance: Secondary | ICD-10-CM

## 2019-11-03 DIAGNOSIS — R627 Adult failure to thrive: Secondary | ICD-10-CM

## 2019-11-03 DIAGNOSIS — F331 Major depressive disorder, recurrent, moderate: Secondary | ICD-10-CM

## 2019-11-03 DIAGNOSIS — I10 Essential (primary) hypertension: Secondary | ICD-10-CM | POA: Diagnosis not present

## 2019-11-03 DIAGNOSIS — F05 Delirium due to known physiological condition: Secondary | ICD-10-CM | POA: Diagnosis not present

## 2019-11-03 DIAGNOSIS — R2681 Unsteadiness on feet: Secondary | ICD-10-CM | POA: Diagnosis not present

## 2019-11-03 DIAGNOSIS — G301 Alzheimer's disease with late onset: Secondary | ICD-10-CM | POA: Diagnosis not present

## 2019-11-03 MED ORDER — ROCKLATAN 0.02-0.005 % OP SOLN: 1.0000 [drp] | Freq: Every day | OPHTHALMIC | 0 refills | Status: AC

## 2019-11-03 MED ORDER — DULOXETINE HCL 30 MG PO CPEP: 30.0000 mg | ORAL_CAPSULE | Freq: Every day | ORAL | 0 refills | Status: AC

## 2019-11-03 MED ORDER — ALENDRONATE SODIUM 70 MG PO TABS: 70.0000 mg | ORAL_TABLET | ORAL | 0 refills | Status: AC

## 2019-11-03 NOTE — Progress Notes (Addendum)
Location:  [place] Room Number: 101-P Place of Service:  SNF (31)  Provider: Marlowe Sax FNP-C   PCP: Maury Dus, MD Patient Care Team: Maury Dus, MD as PCP - General (Family Medicine)  Extended Emergency Contact Information Primary Emergency Contact: Richarda Osmond,  Home Phone: [phone] Relation: None  Code Status: DNR Goals of care:  Advanced Directive information Advanced Directives 11/03/2019  Does Patient Have a Medical Advance Directive? Yes  Type of Advance Directive Out of facility DNR (pink MOST or yellow form)  Does patient want to make changes to medical advance directive? No - Patient declined  Would patient like information on creating a medical advance directive? -     No Known Allergies  Chief Complaint  Patient presents with  . Discharge Note    Discharge from SNF with daughter.    HPI:  86 y.o. female seen today at [facility] for discharge home with Hospice service.she has a medical history of Hypertension,Dementia,Blindness,Anemia,Hearing loss She was here for short term rehabilitation for post hospital admission at [facility] 10/06/2019 - 10/12/2019 for increased lethargy weakness and confusion.Prior to admission she lived at home with daughter and ambulated using a walker but used it less in the past two months stayed in bed.She had overall decline in the past 2-3 months. Her Hgb was 9 and had leukocytosis.chest X-ray done showed possible infiltrates.She was screened for COVID-19 and result was negative.Speech therapy was consulted for aspiration Pneumonia concerns with right lower lobe infiltrate.she completed 5 days course of I.V antibiotics  Which was discontinued prior to discharge to Skilled since she had no fever,cough or shortness of breath.  Her Urine analysis was suggestive for UTI.she was started on antibiotics ceftriaxone,Zoysn and azithromycin for UTI but  later discontinued since urine culture was negative.Hemoccult was done due to low Hgb.she had a CT scan of the brain which was unremarkable.She was discharged to SNF on palliative care and change to hospice if no progress.     She has worked with PT/OT with progressive decline in condition.POA has requested discharge home with hospice service.Order written for Hospice consult.She will be discharged home with Home health PT/OT to continue with ROM, Exercise, Gait stability and muscle strengthening.She will also require Home health Nursing Aides to assist with Activity of daily living.she will also require a Education officer, museum. She will require DME a semi [facility] bed  with  rails to allow to be repositioned in ways not feasible with a normal bed.She will also need a standard Wheelchair with swing away leg rests  to enable her to maintain current level of independence with ADL's which cannot be achieved with walker or cane. Also need a  3-1 bedside commode. Patient unable to safely and independently perform toileting transfer in home with unsteady gait. Home health services will be arranged by facility social worker prior to discharge. Prescription medication will be written x 1 month then patient to follow up with PCP in 1-2 weeks.She is seen in lying in the bed today opens right eye slightly but nonverbal during visit unable to provide HPI information. Facility staff report no new concerns.  Past Medical History:  Diagnosis Date  . Dementia ([place])   . Hypertension     Past Surgical History:  Procedure Laterality Date  . ANKLE SURGERY        reports that she has quit smoking. She has never used  smokeless tobacco. She reports previous alcohol use. She reports that she does not use drugs. Social History   Socioeconomic History  . Marital status: Widowed    Spouse name: Not on file  . Number of children: Not on file  . Years of education: Not on file  . Highest education level: Not on file    Occupational History  . Not on file  Tobacco Use  . Smoking status: Former Research scientist (life sciences)  . Smokeless tobacco: Never Used  Substance and Sexual Activity  . Alcohol use: Not Currently  . Drug use: Never  . Sexual activity: Not on file  Other Topics Concern  . Not on file  Social History Narrative  . Not on file   Social Determinants of Health   Financial Resource Strain:   . Difficulty of Paying Living Expenses:   Food Insecurity:   . Worried About Charity fundraiser in the Last Year:   . Arboriculturist in the Last Year:   Transportation Needs:   . Film/video editor (Medical):   Marland Kitchen Lack of Transportation (Non-Medical):   Physical Activity:   . Days of Exercise per Week:   . Minutes of Exercise per Session:   Stress:   . Feeling of Stress :   Social Connections:   . Frequency of Communication with Friends and Family:   . Frequency of Social Gatherings with Friends and Family:   . Attends Religious Services:   . Active Member of Clubs or Organizations:   . Attends Archivist Meetings:   Marland Kitchen Marital Status:   Intimate Partner Violence:   . Fear of Current or Ex-Partner:   . Emotionally Abused:   Marland Kitchen Physically Abused:   . Sexually Abused:    Functional Status Survey:    No Known Allergies  Pertinent  Health Maintenance Due  Topic Date Due  . DEXA SCAN  Never done  . INFLUENZA VACCINE  10/31/2019  . PNA vac Low Risk Adult (2 of 2 - PCV13) 11/13/2019    Medications: Outpatient Encounter Medications as of 11/03/2019  Medication Sig  . acetaminophen (TYLENOL) 500 MG tablet Take 500 mg by mouth daily as needed for mild pain (aches.).  Marland Kitchen alendronate (FOSAMAX) 70 MG tablet Take 1 tablet (70 mg total) by mouth once a week. Take with a full glass of water on an empty stomach on Mondays.  Marland Kitchen BISACODYL CO 10 mg by Combination route daily as needed. If not relieved by Milk Of Magnesium.  . Brinzolamide-Brimonidine (SIMBRINZA) 1-0.2 % SUSP Apply 1 drop to eye in the  morning, at noon, and at bedtime. Right eye.  . DULoxetine (CYMBALTA) 30 MG capsule Take 1 capsule (30 mg total) by mouth daily.  . Magnesium Hydroxide (MILK OF MAGNESIA PO) Take 30 mLs by mouth daily as needed.  . Multiple Vitamin (MULTIVITAMIN) tablet Take 1 tablet by mouth daily.  . Netarsudil-Latanoprost (ROCKLATAN) 0.02-0.005 % SOLN Apply 1 drop to eye at bedtime. Right eye.  . risperiDONE (RISPERDAL) 1 MG tablet Take 1 mg by mouth in the morning.   . risperiDONE (RISPERDAL) 3 MG tablet Take 3 mg by mouth at bedtime.  . Sodium Phosphates (RA SALINE ENEMA RE) Place rectally daily as needed.  . [DISCONTINUED] alendronate (FOSAMAX) 70 MG tablet Take 70 mg by mouth once a week. Take with a full glass of water on an empty stomach on Mondays.  . [DISCONTINUED] DULoxetine (CYMBALTA) 30 MG capsule Take 30 mg by mouth daily.  . [  DISCONTINUED] Netarsudil-Latanoprost (ROCKLATAN) 0.02-0.005 % SOLN Apply 1 drop to eye at bedtime. Right eye.   No facility-administered encounter medications on file as of 11/03/2019.     Review of Systems  Unable to perform ROS: Dementia    Vitals:   11/03/19 1420  BP: 121/68  Pulse: 67  Resp: 18  Temp: 98.2 F (36.8 C)  SpO2: 95%  Weight: 126 lb (57.2 kg)  Height: 5\' 5"  (1.651 m)   Body mass index is 20.97 kg/m. Physical Exam Vitals reviewed.  Constitutional:      General: She is not in acute distress.    Appearance: She is normal weight. She is not ill-appearing.  HENT:     Head: Normocephalic.  Eyes:     General: No scleral icterus.       Right eye: No discharge.        Left eye: No discharge.     Comments: Left eye blind  Cardiovascular:     Rate and Rhythm: Normal rate and regular rhythm.     Pulses: Normal pulses.     Heart sounds: Normal heart sounds. No murmur heard.  No friction rub. No gallop.   Pulmonary:     Effort: Pulmonary effort is normal. No respiratory distress.     Breath sounds: No wheezing, rhonchi or rales.     Comments:  Poor air entry not following commands to take deep breath  Chest:     Chest wall: No tenderness.  Abdominal:     General: Bowel sounds are normal. There is no distension.     Palpations: Abdomen is soft. There is no mass.     Tenderness: There is no abdominal tenderness. There is no right CVA tenderness, left CVA tenderness, guarding or rebound.  Musculoskeletal:        General: No swelling or tenderness.     Right lower leg: No edema.     Left lower leg: No edema.     Comments: Moves x 4 extremities seen in bed   Skin:    General: Skin is warm and dry.     Coloration: Skin is not pale.     Findings: No bruising, erythema or rash.  Neurological:     Mental Status: She is alert.     Gait: Gait abnormal.     Comments: Alert but nonverbal and not following commands during visit   Psychiatric:        Mood and Affect: Mood normal.        Speech: She is noncommunicative.      Labs reviewed: Basic Metabolic Panel: Recent Labs    10/06/19 1831 10/06/19 1831 10/07/19 0417 10/07/19 0744 10/18/19 0000  NA 141   < > 143 146* 139  K 3.8   < > 3.0* 3.0* 3.6  CL 108  --  110  --  104  CO2 24  --  24  --  25*  GLUCOSE 120*  --  104*  --   --   BUN 28*  --  23  --  8  CREATININE 0.77  --  0.64  --  0.3*  CALCIUM 8.9  --  8.8*  --  9.1   < > = values in this interval not displayed.   Liver Function Tests: Recent Labs    10/06/19 1831  AST 23  ALT 13  ALKPHOS 67  BILITOT 0.6  PROT 5.8*  ALBUMIN 2.5*   Recent Labs    10/06/19 1831  LIPASE  18   Recent Labs    10/07/19 0417  AMMONIA 10   CBC: Recent Labs    10/06/19 1831 10/06/19 1831 10/07/19 0417 10/07/19 0417 10/07/19 0742 10/07/19 0744 10/18/19 0000  WBC 14.5*   < > 13.1*  --  12.5*  --  6.8  NEUTROABS 12.7*  --   --   --   --   --   --   HGB 9.0*   < > 8.7*   < > 8.8* 8.8* 10.5*  HCT 30.3*   < > 29.4*  --  29.2* 26.0*  --   MCV 77.3*  --  78.0*  --  79.1*  --   --   PLT 342   < > 356  --  330  --  282     < > = values in this interval not displayed.   CBG: Recent Labs    10/06/19 1839  GLUCAP 122*    Procedures and Imaging Studies During Stay: CT Head Wo Contrast  Result Date: 10/06/2019 CLINICAL DATA:  Altered mental status EXAM: CT HEAD WITHOUT CONTRAST TECHNIQUE: Contiguous axial images were obtained from the base of the skull through the vertex without intravenous contrast. COMPARISON:  MRI, CT 06/15/2003 FINDINGS: Brain: There is an indeterminate rounded 5 mm hyperdense focus along the lateral left pons. Sequela of remote left pontine lacunar infarct is noted in a similar distribution to comparison MRI. Suspect remote bilateral basal ganglia infarcts. No evidence of acute infarction, hemorrhage, hydrocephalus, extra-axial collection or midline shift. Symmetric prominence of the ventricles, cisterns and sulci compatible with parenchymal volume loss. Patchy areas of white matter hypoattenuation are most compatible with chronic microvascular angiopathy. Vascular: Atherosclerotic calcification of the carotid siphons. No hyperdense vessel. Skull: No calvarial fracture or suspicious osseous lesion. No scalp swelling or hematoma. Sinuses/Orbits: Shrunken hyperdense appearance of the left globe is new since 2005. 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 No acute abnormality of the included orbits. Atherosclerotic calcification of the carotid siphons. No hyperdense vessel. Other: None IMPRESSION: 1. Indeterminate rounded 5 mm hyperdense focus along the lateral left pons. While this may represent a cavernous malformation, dolichoectasia of a crossing vessel or other indeterminate lesion, incompletely assessed given the amount of streak at the level of the skull base. Could be further visualized with MRI with contrast if patient is able to tolerate. 2. 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 3. Chronic microvascular angiopathy and parenchymal volume loss.  4. Shrunken hyperdense appearance of the left globe is new since 2005. 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 Electronically Signed   By: Lovena Le M.D.   On: 10/06/2019 20:14   MR BRAIN WO CONTRAST  Result Date: 10/07/2019 CLINICAL DATA:  Encephalopathy EXAM: MRI HEAD WITHOUT CONTRAST TECHNIQUE: Multiplanar, multiecho pulse sequences of the brain and surrounding structures were obtained without intravenous contrast. COMPARISON:  None. FINDINGS: Brain: No acute infarct, acute hemorrhage or extra-axial collection. Early confluent hyperintense T2-weighted signal of the periventricular and deep white matter, most commonly due to chronic ischemic microangiopathy. There is generalized atrophy without lobar predilection. Single focus of chronic microhemorrhage in the left cerebellum. A partially empty sella is incidentally noted. Vascular: Normal flow voids. Skull and upper cervical spine: Normal marrow signal. Sinuses/Orbits: Negative. Other: None IMPRESSION: 1. No acute intracranial abnormality. 2. Generalized atrophy and findings of chronic ischemic microangiopathy. Electronically Signed   By: Ulyses Jarred M.D.   On: 10/07/2019 03:24  DG Chest Port 1 View  Result Date: 10/06/2019 CLINICAL DATA:  Near syncope. EXAM: PORTABLE CHEST 1 VIEW COMPARISON:  None FINDINGS: The lungs are hyperinflated. Mild to moderate severity atelectasis and/or infiltrate is seen within the right lung base. There is a small right pleural effusion. No pneumothorax is identified. The heart size and mediastinal contours are within normal limits. There is marked severity calcification of the aortic arch. Multilevel degenerative changes seen throughout the thoracic spine. IMPRESSION: 1. Mild to moderate severity right basilar atelectasis and/or infiltrate. 2. Small right pleural effusion. Electronically Signed   By: Virgina Norfolk M.D.   On: 10/06/2019 19:23    Assessment/Plan:     1. Failure to thrive  in adult Has had progressive decline in the past 2-3 months was not walking much at home prior to hospital admission.Has worked with PT/OT without any progress.Several medication discontinued during recent hospitalization.   - A semi [facility] bed  with  rails to allow to be repositioned in ways not feasible with a normal bed.  - A standard Wheelchair with swing away leg rests  to enable her to maintain current level of independence with ADL's which cannot be achieved with walker or cane.   - A  3-1 bedside commode. Patient unable to safely and independently perform toileting transfer in home with unsteady gait. - Family request Hospice Consult - CBC,BMP in 1-2 weeks with PCP   2. Benign essential hypertension B/p stable.Not on any medication.  3. Oropharyngeal dysphagia Seen by Speech therapy aspiration precaution.   4. Primary degenerative dementia of the Alzheimer type, senile onset, with delirium ([place]) - Progressive decline will discharge home with daughter with Hospice service consult.  -continue with supportive care.   5. Moderate episode of recurrent major depressive disorder (HCC) Mood stable on current medication - DULoxetine (CYMBALTA) 30 MG capsule; Take 1 capsule (30 mg total) by mouth daily.  Dispense: 30 capsule; Refill: 0  6. Age-related osteoporosis without current pathological fracture Not ambulatory consider discontinued Fosamax if unable to sit upright for 30 minutes.  - alendronate (FOSAMAX) 70 MG tablet; Take 1 tablet (70 mg total) by mouth once a week. Take with a full glass of water on an empty stomach on Mondays.  Dispense: 4 tablet; Refill: 0  7.Unsteady Gait  - Discharge with Home Health PT/OT ROM,exercise,Gait stability and muscle strengthening.  - A standard Wheelchair with swing away leg rests  to enable her to maintain current level of independence with ADL's which cannot be achieved with walker or cane.   - A  3-1 bedside commode. Patient  unable to safely and independently perform toileting transfer in home with unsteady gait.  Patient is being discharged with the following home health services:   -PT/OT for ROM, exercise, gait stability and muscle strengthening -  [place] [facility] for ADL's assist  - Social Worker  - Hospice Service   Patient is being discharged with the following durable medical equipment:    - A semi [facility] bed  with  rails to allow to be repositioned in ways not feasible with a normal bed.  - A standard Wheelchair with swing away leg rests  to enable her to maintain current level of independence with ADL's which cannot be achieved with walker or cane.   - A  3-1 bedside commode. Patient unable to safely and independently perform toileting transfer in home with unsteady gait.  Patient has been advised to f/u with their PCP  in 1-2 weeks to for a transitions of care visit.Social services at their facility was responsible for arranging this appointment.  Pt was provided with adequate prescriptions of noncontrolled medications to reach the scheduled appointment.For controlled substances, a limited supply was provided as appropriate for the individual patient. If the pt normally receives these medications from a pain clinic or has a contract with another physician, these medications should be received from that clinic or physician only).    Future labs/tests needed:  CBC, BMP in 1-2 weeks PCP

## 2019-11-05 ENCOUNTER — Other Ambulatory Visit: Payer: Self-pay | Admitting: *Deleted

## 2019-11-05 NOTE — Patient Outreach (Signed)
[facility] Post- Acute Care Coordinator follow up. Member screened for potential [facility] Care Management needs as a benefit of [place] [employer].  Verified with [facility] SW that Autumn Wells transitioned to home on 11/04/19 from SNF with home health and hospice referral.   Telephone call made to daughter/ Towanda Malkin [phone]. Patient identifiers confirmed. [name] confirms member has enrolled with [place] hospice.   Will sign off. No identifiable [facility] Care Management needs at this time.  Marthenia Rolling, MSN-Ed, RN,BSN [place] Acute Care Coordinator [phone] [facility]) [phone]  (Toll free office)

## 2019-11-09 ENCOUNTER — Ambulatory Visit: Payer: Medicare Other | Admitting: Neurology

## 2019-12-01 DEATH — deceased

## 2022-02-22 IMAGING — MR MR HEAD W/O CM
12 of 13 series · 44 of 48 positions shown · non-contrast
Comparison: None.

CLINICAL DATA: Encephalopathy

EXAM:
MRI HEAD WITHOUT CONTRAST
TECHNIQUE: Multiplanar, multiecho pulse sequences of the brain and surrounding
structures were obtained without intravenous contrast.

[Series 5: DWI · axial · 3.0mm · 0.88mm/px · z∈[-66,+81]mm · 9 of 100 slices shown (1 of 4)]
[im 1/100]
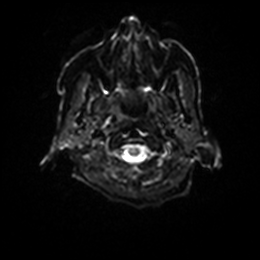
[im 13/100]
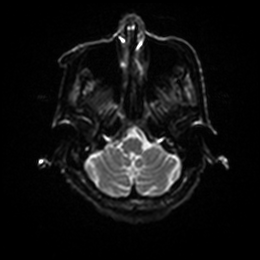
[im 25/100]
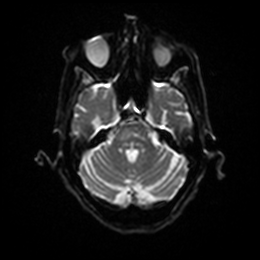
[im 38/100]
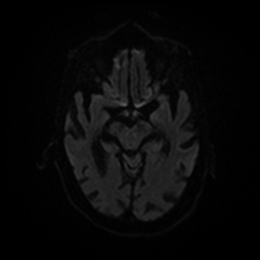
[im 50/100]
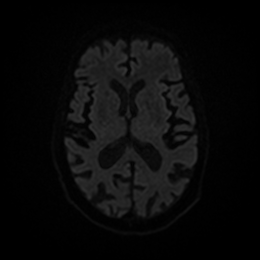
[im 62/100]
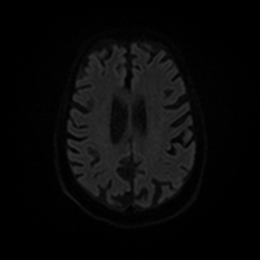
[im 75/100]
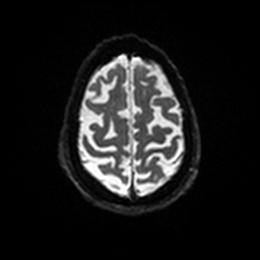
[im 87/100]
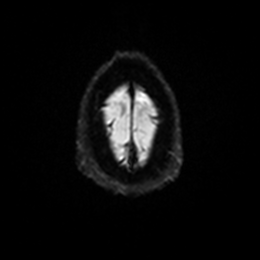
[im 100/100]
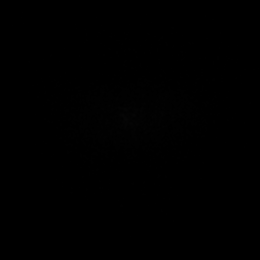

[Series 6: DWI · axial · 3.0mm · 0.88mm/px · z∈[-66,+81]mm · 4 of 50 slices shown (2 of 4)]
[im 1/50]
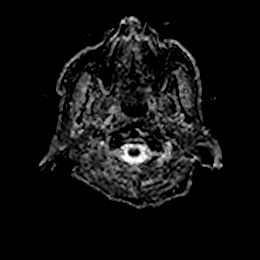
[im 17/50]
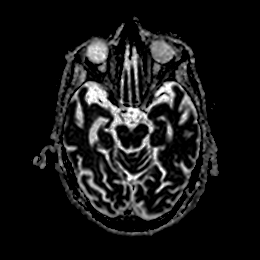
[im 33/50]
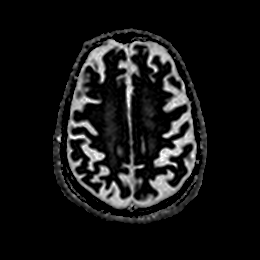
[im 50/50]
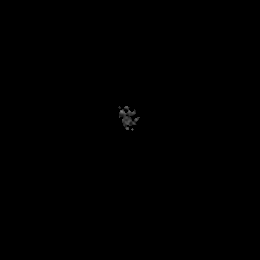

[Series 7: DWI · coronal · 4.0mm · 0.88mm/px · 5 of 66 slices shown (3 of 4)]
[im 1/66]
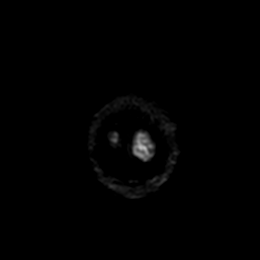
[im 17/66]
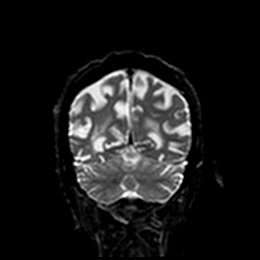
[im 33/66]
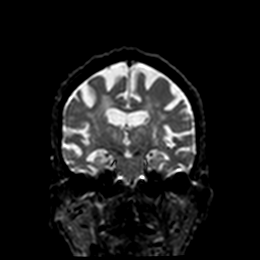
[im 49/66]
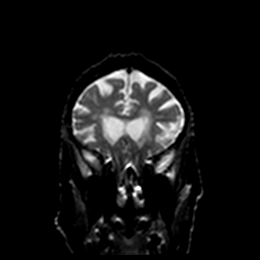
[im 66/66]
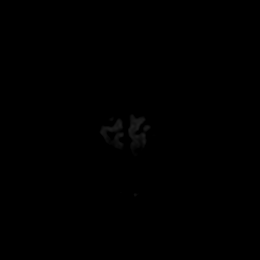

[Series 8: DWI · coronal · 4.0mm · 0.88mm/px · 2 of 33 slices shown (4 of 4)]
[im 1/33]
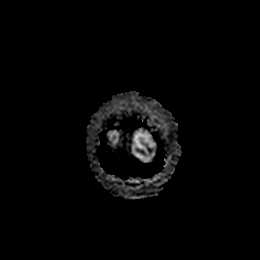
[im 33/33]
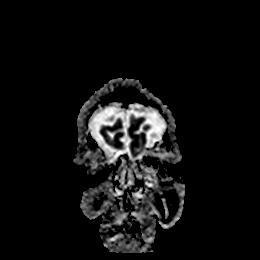

[Series 9: T1 · sagittal · 5.0mm · 0.75mm/px · 2 of 23 slices shown]
[im 1/23]
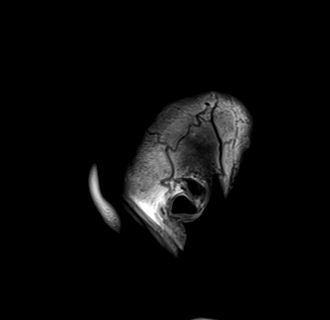
[im 23/23]
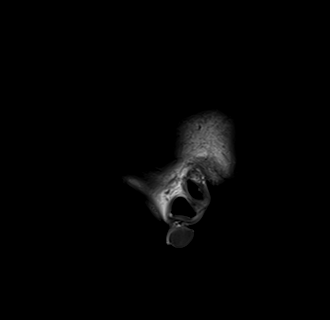

[Series 10: T2 · axial · 5.0mm · 0.72mm/px · z∈[-64,+80]mm · 2 of 25 slices shown (1 of 2)]
[im 1/25]
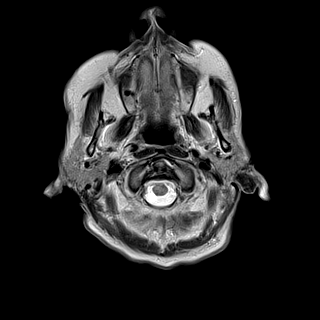
[im 25/25]
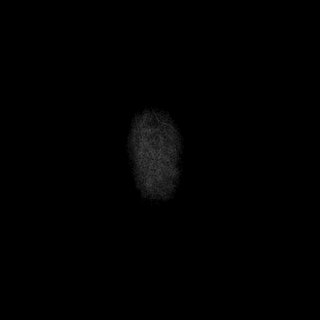

[Series 11: FLAIR · axial · 5.0mm · 0.45mm/px · z∈[-64,+80]mm · 2 of 25 slices shown]
[im 1/25]
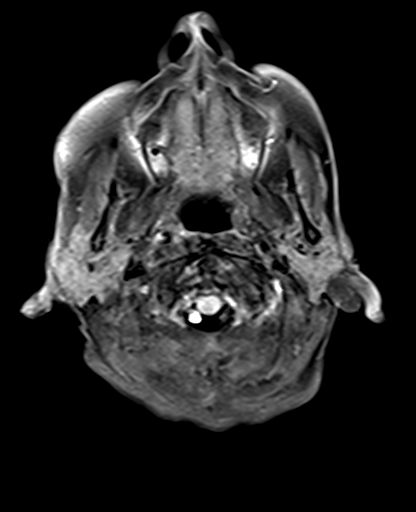
[im 25/25]
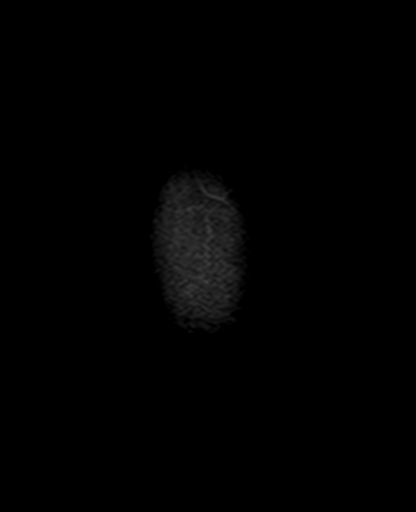

[Series 12: mag_images · axial · 3.0mm · 0.90mm/px · z∈[-81,+96]mm · 4 of 60 slices shown]
[im 1/60]
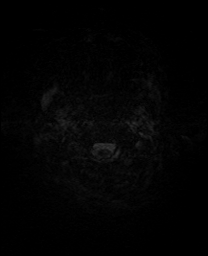
[im 20/60]
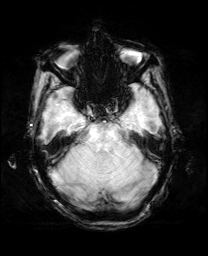
[im 40/60]
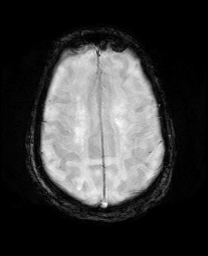
[im 60/60]
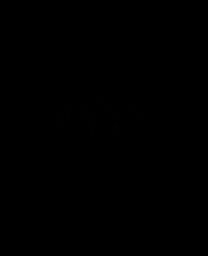

[Series 13: pha_images · axial · 3.0mm · 0.90mm/px · z∈[-81,+93]mm · 4 of 56 slices shown]
[im 1/56]
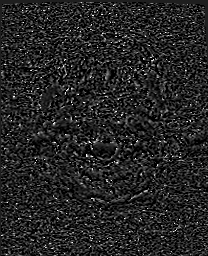
[im 19/56]
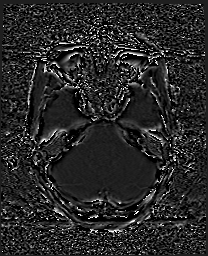
[im 37/56]
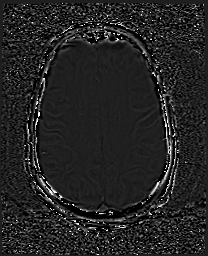
[im 56/56]
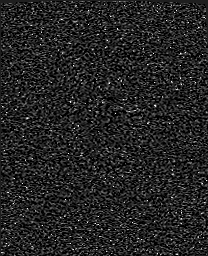

[Series 14: swi_images · axial · 3.0mm · 0.90mm/px · z∈[-81,+96]mm · 4 of 60 slices shown]
[im 1/60]
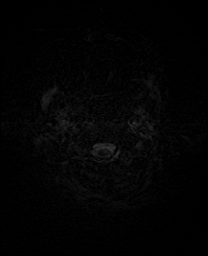
[im 20/60]
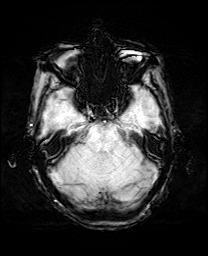
[im 40/60]
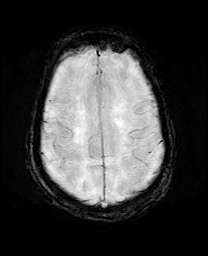
[im 60/60]
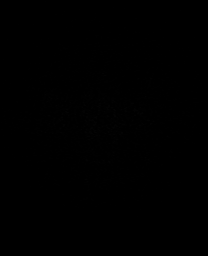

[Series 15: mip_images(sw) · axial · 24.0mm · 0.90mm/px · z∈[-70,+86]mm · 4 of 53 slices shown]
[im 1/53]
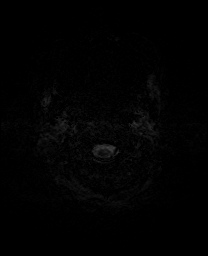
[im 18/53]
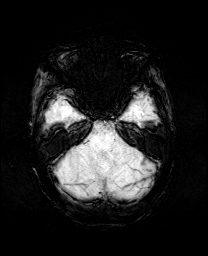
[im 35/53]
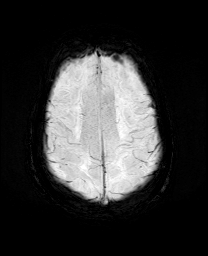
[im 53/53]
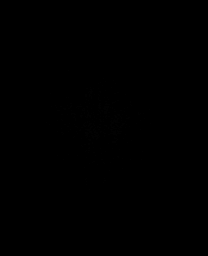

[Series 17: T2 · coronal · 5.0mm · 0.34mm/px · 2 of 29 slices shown (2 of 2)]
[im 1/29]
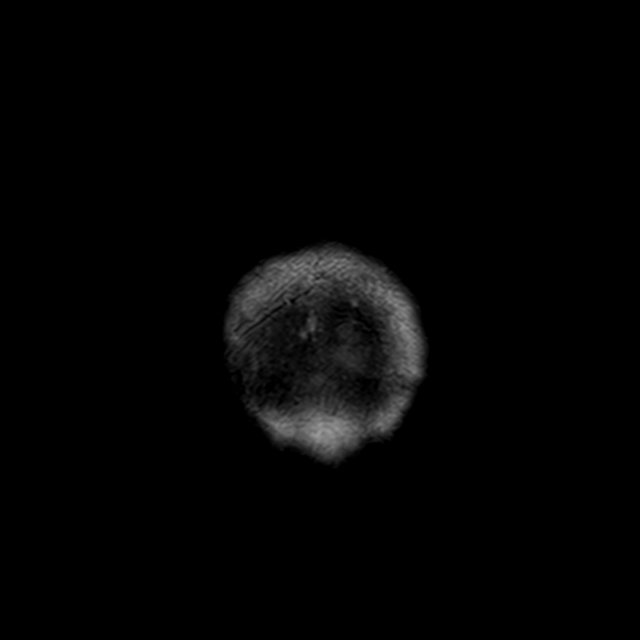
[im 29/29]
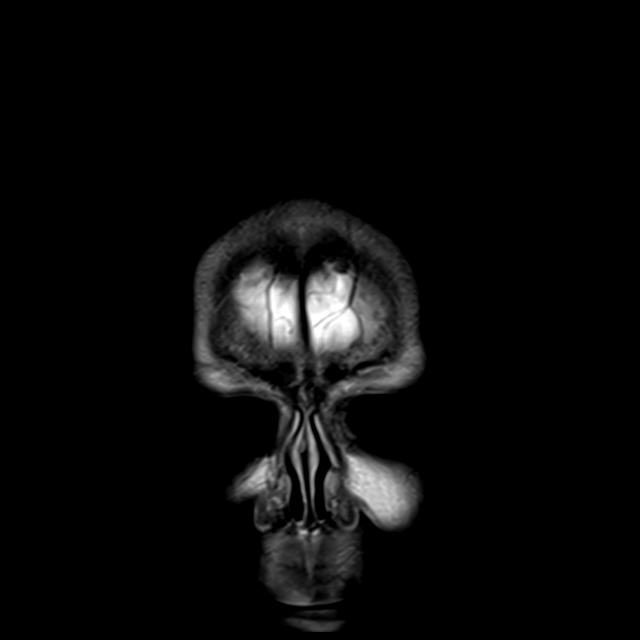

[44 of 48 positions shown; findings below may reference images not displayed]

FINDINGS: Brain: No acute infarct, acute hemorrhage or extra-axial collection.
Early confluent hyperintense T2-weighted signal of the
periventricular and deep white matter, most commonly due to chronic
ischemic microangiopathy. There is generalized atrophy without lobar
predilection. Single focus of chronic microhemorrhage in the left
cerebellum. A partially empty sella is incidentally noted.

Vascular: Normal flow voids.

Skull and upper cervical spine: Normal marrow signal.

Sinuses/Orbits: Negative.

Other: None
IMPRESSION: 1. No acute intracranial abnormality.
2. Generalized atrophy and findings of chronic ischemic
microangiopathy.
